# Patient Record
Sex: Female | Born: 1990 | Race: Black or African American | Hispanic: No | Marital: Single | Smoking: Never smoker
Health system: Southern US, Community
[De-identification: ages and names within clinical notes are randomized; demographics above are authoritative.]

## PROBLEM LIST (undated history)

## (undated) DIAGNOSIS — O368190 Decreased fetal movements, unspecified trimester, not applicable or unspecified: Secondary | ICD-10-CM

## (undated) DIAGNOSIS — K5900 Constipation, unspecified: Secondary | ICD-10-CM

## (undated) DIAGNOSIS — B9689 Other specified bacterial agents as the cause of diseases classified elsewhere: Secondary | ICD-10-CM

## (undated) DIAGNOSIS — N760 Acute vaginitis: Secondary | ICD-10-CM

## (undated) HISTORY — DX: Other specified bacterial agents as the cause of diseases classified elsewhere: B96.89

## (undated) HISTORY — DX: Other specified bacterial agents as the cause of diseases classified elsewhere: N76.0

## (undated) HISTORY — DX: Constipation, unspecified: K59.00

## (undated) HISTORY — PX: NO PAST SURGERIES: SHX2092

---
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
## 2013-03-27 LAB — BLOOD TYPE, (ABO+RH)
ABO,Rh: A POS
TYPE, ABO & RH, EXTERNAL: A POS

## 2013-03-27 LAB — HEMOGLOBIN: Hgb, External: 13

## 2013-03-27 LAB — ANTIBODY SCREEN: Antibody screen, External: NEGATIVE

## 2013-03-27 LAB — HEMOGLOBIN FRACTIONATION
HGB EVAL, EXTERNAL: NEGATIVE
Hemoglobin eval., External: NEGATIVE

## 2013-03-27 LAB — RPR
RPR, EXTERNAL: NEGATIVE
RPR, External: NEGATIVE

## 2013-03-27 LAB — HEPATITIS B SURFACE AG W/REFLEX: HBsAg, External: NEGATIVE

## 2013-03-27 LAB — PLATELET COUNT: Platelet cnt., External: 180

## 2013-03-27 LAB — URINALYSIS W/O MICRO

## 2013-03-27 LAB — RUBELLA AB, IGG: Rubella, External: IMMUNE

## 2013-03-27 LAB — HIV-1 AB WESTERN BLOT CONFIRM ONLY
HIV, EXTERNAL: NEGATIVE
HIV, External: NEGATIVE

## 2013-03-27 LAB — HEMATOCRIT: Hct, External: 39.3

## 2013-04-24 LAB — N GONORRHOEAE, DNA PROBE: Gonorrhea, External: NEGATIVE

## 2013-04-24 LAB — CHLAMYDIA DNA PROBE: Chlamydia, External: NEGATIVE

## 2013-04-24 LAB — PAP SMEAR

## 2013-05-08 LAB — AMB POC US OB FETAL NUCHAL TRANS, 1ST GESTATION: NT, External: NEGATIVE

## 2013-07-02 NOTE — Progress Notes (Signed)
20 weeks transfer for consult/Exam/ANA scan. Flu vaccine 05/06/13 per pt. Pt will obtain records for CF testing results.

## 2013-07-03 LAB — CBC WITH AUTOMATED DIFF
ABS. BASOPHILS: 0 10*3/uL (ref 0.0–0.2)
ABS. EOSINOPHILS: 0 10*3/uL (ref 0.0–0.4)
ABS. IMM. GRANS.: 0 10*3/uL (ref 0.0–0.1)
ABS. MONOCYTES: 0.4 10*3/uL (ref 0.1–0.9)
ABS. NEUTROPHILS: 3.9 10*3/uL (ref 1.4–7.0)
Abs Lymphocytes: 1.4 10*3/uL (ref 0.7–3.1)
BASOPHILS: 0 %
EOSINOPHILS: 0 %
HCT: 29.2 % — ABNORMAL LOW (ref 34.0–46.6)
HGB: 10.3 g/dL — ABNORMAL LOW (ref 11.1–15.9)
IMMATURE GRANULOCYTES: 0 %
Lymphocytes: 24 %
MCH: 31.3 pg (ref 26.6–33.0)
MCHC: 35.3 g/dL (ref 31.5–35.7)
MCV: 89 fL (ref 79–97)
MONOCYTES: 8 %
NEUTROPHILS: 68 %
PLATELET: 165 10*3/uL (ref 150–379)
RBC: 3.29 x10E6/uL — ABNORMAL LOW (ref 3.77–5.28)
RDW: 14.4 % (ref 12.3–15.4)
WBC: 5.8 10*3/uL (ref 3.4–10.8)

## 2013-07-03 LAB — HCV AB W/RFLX TO VERIFICATION: HCV Ab: <0.1 s/co ratio (ref 0.0–0.9)

## 2013-07-03 LAB — COMMENT

## 2013-07-03 LAB — TSH RFX ON ABNORMAL TO FREE T4: TSH: 2.41 u[IU]/mL (ref 0.450–4.500)

## 2013-07-03 NOTE — Progress Notes (Signed)
flushot offered

## 2013-07-04 NOTE — Progress Notes (Signed)
Theresa Boyd

## 2013-07-17 NOTE — Telephone Encounter (Signed)
Pt c/o some low pelvic pain- at her pubic bone- for the past 30 minutes or so. Getting better now after laying down. No uti sx, no bleeding, no vaginal discharge, no fever, no contraction-like pains. Probably round ligament pain. rec rest, fluids, getting up slowly. If sx increase to go to [facility] to be checked.

## 2013-08-01 NOTE — L&D Delivery Note (Signed)
Delivery Note    Obstetrician:  Vista DeckMatthew L Mikhala Kenan, MD    Assistant: none    Pre-Delivery Diagnosis: Term pregnancy, Spontaneous labor or Single fetus    Post-Delivery Diagnosis: Living newborn infant(s), Female or named Isabelle(Bell)    Intrapartum Event: Meconium    Procedure: Spontaneous vaginal delivery    Delivery Summary    Patient: Theresa Boyd MRN: [id number]  SSN: [id number]    Date of Birth: 12-Mar-1991  Age: 23 y.o.  Sex: female       Information for the patient's newborn:  Marney DoctorFuller, Baby Girl [[id number]][[id number]]       Labor Events:   Preterm Labor: No   Rupture Date: 11/19/2013   Rupture Time: 11:40 AM   Rupture Type AROM   Amniotic Fluid Volume: Moderate      Amniotic Fluid Description: Meconium   Induction: None       Augmentation: Oxytocin;AROM   Labor Events:       Cervical Ripening:     None     Delivery Events:  Episiotomy: None   Laceration(s): Right periurethral     Repaired: None    Number of Repair Packets:     Suture Type and Size:       Estimated Blood Loss (ml): 250ml       Delivery Date: 11/19/2013    Delivery Time: 7:43 PM  Delivery Type: Spontaneous Vaginal Delivery   Sex:  Female     Gestational Age: 7280w6d  Delivery Clinician:  Vista DeckMatthew L Zackrey Dyar  Living Status: Yes  Delivery Location: L&D              APGARS  One minute Five minutes Ten minutes   Skin Color: 0   1       Heart Rate: 2   2       Reflex Irritability: 2   2       Muscle Tone: 2   2       Respiration: 2   2       Total: 8  9        Presentation: Vertex    Position: Right Occiput Anterior  Resuscitation Method:  Suctioning-bulb  Tactile Stimulation     Meconium Stained: Thin        Cord Information: 3 Vessels      Cord Events: Nuchal  Other (Comment)  X1     Cord Blood Sent?:  Yes      Blood Gases Sent?:  Yes    Placenta:  Date: 11/19/2013   Time: 7:46 PM  Removal: Expressed    Appearance: Normal  Intact       Newborn Measurements:  Birth Weight: 7 lb 14.1 oz (3.575 kg)    Birth Length: 52 cm    Head Circumference: 35.5 cm    Chest  Circumference: 33.5 cm    Abdominal Girth:      Other Providers:   Bruce DonathMatthew L Kionna Brier  Jessica C Bradyn Soward  Judy Clour  Dola ArgyleJames R Simril  Mark A Klapperich  Jinny BlossomKaren B Covington  Toa AltaHelen M Braysen Cloward  Jeanice LimLacinda G Snow  Buelah ManisHannah E Phillips Obstetrician  Primary Nurse  Charge Nurse  Anesthesiologist  CRNA  Nurse Practitioner  Respiratory Therapist  Tech  Student Nurse                 Group B Strep:   Lab Results   Component Value Date/Time    GrBS negative 10/17/2013     Information for the patient's  newborn:  Marney DoctorFuller, Baby Girl [[id number]][[id number]]     No results found for this basename: PCTABR, PCTDIG, BILI     No results found for this basename: aph, apco2, apo2, ahco3, abec, abdc, o2st, site, rscom               Cord Blood Results:   Information for the patient's newborn:  Marney DoctorFuller, Baby Girl [[id number]][[id number]]     No results found for this basename: PCTABR, PCTDIG, BILI     Prenatal Labs:     Lab Results   Component Value Date/Time    ABO/Rh(D) A POSITIVE 11/19/2013  9:00 AM    HBsAg Negative 03/27/2013    HIV Negative 03/27/2013    Rubella Immune 03/27/2013    RPR Negative 03/27/2013    Gonorrhea Negative 04/24/2013    Chlamydia Negative 04/24/2013    GrBS negative 10/17/2013        Attending Attestation: I was present and scrubbed for the entire procedure    Signed By:  Vista DeckMatthew L Zylah Elsbernd, MD     November 19, 2013

## 2013-08-06 NOTE — Progress Notes (Signed)
Glu next

## 2013-08-10 ENCOUNTER — Inpatient Hospital Stay: Payer: Self-pay

## 2013-08-10 NOTE — Progress Notes (Signed)
Pt presented to L&D complaining of decreased fetal movement.  Pt states she did not feel any movement yesterday at lunch and drank juice and laid on her left side.  Pt states the baby started moving.  This morning she again did not feel movement and did the same thing with no results.  EFM on and assessment started.

## 2013-08-10 NOTE — Progress Notes (Signed)
Dr Maudie MercuryMazzoli notified of pt status.  Decreased fetal movement.  FHR 145 appropriate for gest age.  No uterine contractions.  Pt denies pain or leaking fluid.  Orders to discharge pt with kick count instructions.

## 2013-08-10 NOTE — Progress Notes (Signed)
Discharge instructions reviewed.  Pt verbalizes understanding.  Pt discharged home with family at side.

## 2013-09-19 NOTE — Progress Notes (Signed)
tdap

## 2013-09-20 LAB — CBC WITH AUTOMATED DIFF
ABS. BASOPHILS: 0 10*3/uL (ref 0.0–0.2)
ABS. EOSINOPHILS: 0 10*3/uL (ref 0.0–0.4)
ABS. IMM. GRANS.: 0 10*3/uL (ref 0.0–0.1)
ABS. MONOCYTES: 0.6 10*3/uL (ref 0.1–0.9)
ABS. NEUTROPHILS: 4.1 10*3/uL (ref 1.4–7.0)
Abs Lymphocytes: 2 10*3/uL (ref 0.7–3.1)
BASOPHILS: 0 %
EOSINOPHILS: 1 %
HCT: 31.2 % — ABNORMAL LOW (ref 34.0–46.6)
HGB: 11 g/dL — ABNORMAL LOW (ref 11.1–15.9)
IMMATURE GRANULOCYTES: 0 %
Lymphocytes: 30 %
MCH: 31.9 pg (ref 26.6–33.0)
MCHC: 35.3 g/dL (ref 31.5–35.7)
MCV: 90 fL (ref 79–97)
MONOCYTES: 8 %
NEUTROPHILS: 61 %
PLATELET: 148 10*3/uL — ABNORMAL LOW (ref 150–379)
RBC: 3.45 x10E6/uL — ABNORMAL LOW (ref 3.77–5.28)
RDW: 13.3 % (ref 12.3–15.4)
WBC: 6.8 10*3/uL (ref 3.4–10.8)

## 2013-09-20 LAB — GEST. DIABETES 1-HR SCREEN: Gestational Diabetes Screen: 81 mg/dL (ref 65–139)

## 2013-09-29 ENCOUNTER — Inpatient Hospital Stay

## 2013-09-29 LAB — CBC W/O DIFF
HCT: 32.2 % — ABNORMAL LOW (ref 35.8–46.3)
HGB: 10.9 g/dL — ABNORMAL LOW (ref 11.7–15.4)
MCH: 30.9 PG (ref 26.1–32.9)
MCHC: 33.9 g/dL (ref 31.4–35.0)
MCV: 91.2 FL (ref 79.6–97.8)
MPV: 12 FL (ref 10.8–14.1)
PLATELET: 127 10*3/uL — ABNORMAL LOW (ref 150–450)
RBC: 3.53 M/uL — ABNORMAL LOW (ref 4.05–5.25)
RDW: 12.8 % (ref 11.9–14.6)
WBC: 6.3 10*3/uL (ref 4.3–11.1)
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
## 2013-09-29 NOTE — Progress Notes (Signed)
Report received from Jose PersiaMelanie Clement, Charity fundraiserN. Care assumed. Patient to be discharged home at 1930, after 4 hours of monitoring.

## 2013-09-29 NOTE — Progress Notes (Signed)
Report to Kate SableJessica Donahue rn care of pt relinquished.

## 2013-09-29 NOTE — Progress Notes (Signed)
Pt to room 439 for triage with chief complaint of decreased fetal movement.. Assessment begins, EFM and Toco applied and tracing well.
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
## 2013-09-29 NOTE — Progress Notes (Signed)
Patient discharged home with husband.

## 2013-09-29 NOTE — Progress Notes (Signed)
Discharge instructions reviewed with the patient and her husband. Copy signed and placed on chart, copy given to patient.

## 2013-10-01 LAB — FETAL HEMOGLOBIN SCREEN

## 2013-10-03 NOTE — Progress Notes (Signed)
Classes rec

## 2013-10-07 NOTE — Telephone Encounter (Signed)
Pt thinks she lost her mucous plug. No contractions, no water leaking. Has 3/19 ROB appt w/ [facility]. To call or go to the hospital if she has strong contractions every 5 minutes or if SROM.

## 2013-10-10 NOTE — Telephone Encounter (Signed)
Pt c/o sore throat, headache, chest congestion. Discussed otc medications she can take including Tylenol, Mucinex, and Chloraseptic spray. If sx continue or increase to see her PCP

## 2013-10-17 LAB — GYN RAPID GP B STREP: GrBStrep, External: NEGATIVE

## 2013-10-17 NOTE — Progress Notes (Signed)
gbs and cbc today

## 2013-10-18 LAB — CBC WITH AUTOMATED DIFF
ABS. BASOPHILS: 0 10*3/uL (ref 0.0–0.2)
ABS. EOSINOPHILS: 0 10*3/uL (ref 0.0–0.4)
ABS. IMM. GRANS.: 0 10*3/uL (ref 0.0–0.1)
ABS. MONOCYTES: 0.6 10*3/uL (ref 0.1–0.9)
ABS. NEUTROPHILS: 4.1 10*3/uL (ref 1.4–7.0)
Abs Lymphocytes: 1.6 10*3/uL (ref 0.7–3.1)
BASOPHILS: 0 %
EOSINOPHILS: 0 %
HCT: 32.1 % — ABNORMAL LOW (ref 34.0–46.6)
HGB: 11.1 g/dL (ref 11.1–15.9)
IMMATURE GRANULOCYTES: 0 %
Lymphocytes: 26 %
MCH: 31 pg (ref 26.6–33.0)
MCHC: 34.6 g/dL (ref 31.5–35.7)
MCV: 90 fL (ref 79–97)
MONOCYTES: 9 %
NEUTROPHILS: 65 %
PLATELET: 128 10*3/uL — ABNORMAL LOW (ref 150–379)
RBC: 3.58 x10E6/uL — ABNORMAL LOW (ref 3.77–5.28)
RDW: 13.3 % (ref 12.3–15.4)
WBC: 6.4 10*3/uL (ref 3.4–10.8)

## 2013-10-19 LAB — STREP GP B NAA+RFLX: Grp B Strep Suscept.: NEGATIVE

## 2013-10-24 NOTE — Progress Notes (Signed)
Classes/ some nausea no other c/o

## 2013-10-30 NOTE — Progress Notes (Signed)
No travel

## 2013-11-05 NOTE — Progress Notes (Signed)
No travel

## 2013-11-07 NOTE — Telephone Encounter (Signed)
OB pt c/o very itchy rash on her feet, hands, & belly.  PUPPS?Marland Kitchen. Will use Benadryl, HC cream, Aveeno bath. Will see [facility] in the am. (pt also has low platelet count)

## 2013-11-08 NOTE — Progress Notes (Signed)
The patient is a 23 y.o. female who is seen for contact derm denies f/c food changes etc.  Onset was several days ago. Symptoms have been unchanged since. Associated symptoms include:  rash.    HISTORY:      Patient's last menstrual period was 02/06/2013.  Sexual History:    Contraception:    Current Outpatient Prescriptions on File Prior to Visit   Medication Sig Dispense Refill   ??? prenatal no.39-iron-FA #6-dha 30 mg iron-1 mg -300 mg cmpk Take  by mouth.       ??? FERROUS SULFATE, DRIED (IRON, DRIED, PO) Take 50 mg by mouth.       ??? cholecalciferol, vitamin D3, (VITAMIN D3) 2,000 unit tab Take  by mouth.         No current facility-administered medications on file prior to visit.       ROS:  Feeling well. No dyspnea or chest pain on exertion.  No abdominal pain, change in bowel habits, black or bloody stools.  No urinary tract symptoms. GYN ROS: .    PHYSICAL EXAM:  Blood pressure 110/70, weight 181 lb (82.101 kg), last menstrual period 02/06/2013.    The patient appears well, alert, oriented x 3, in no distress.  Lungs are clear. Heart RRR, no murmurs. Abdomen soft without tenderness, guarding, mass or organomegaly.  Pelvic: .    ASSESSMENT:  No diagnosis found.    PLAN:  Diagnosis explained in detail, including differential.  No orders of the defined types were placed in this encounter.       Contact derm dx all support measures

## 2013-11-12 NOTE — Progress Notes (Signed)
Pt requests MRSA check               nasal swab done              pt w/o any cutaneous lesions

## 2013-11-12 NOTE — Addendum Note (Signed)
Addended by: Forrestine HimKURTA, Orbie Grupe A on: 11/12/2013 11:57 AM     Modules accepted: Orders

## 2013-11-12 NOTE — Progress Notes (Signed)
Labor dx

## 2013-11-15 LAB — MRSA SCREENING CULTURE: MRSA Screening Culture: NEGATIVE

## 2013-11-19 ENCOUNTER — Inpatient Hospital Stay
Admit: 2013-11-19 | Discharge: 2013-11-21 | Disposition: A | Discharge status: Home / Self Care | Source: Ambulatory Visit | Attending: Obstetrics & Gynecology | Admitting: Obstetrics & Gynecology

## 2013-11-19 DIAGNOSIS — O480 Post-term pregnancy: Secondary | ICD-10-CM

## 2013-11-19 LAB — RT--CORD BLOOD GAS
BASE EXCESS,CBA: 0.7 mmol/L (ref 0.0–3.0)
HCO3,CORD BLD ARTERIAL: 29 mmol/L — ABNORMAL HIGH (ref 22–26)
PCO2,CORD BLD ARTERIAL: 64 mmHg — ABNORMAL HIGH (ref 33–49)
PH,CORD BLD ARTERIAL: 7.278 (ref 7.21–7.31)
PO2,CORD BLD ARTERIAL: <36 mmHg — ABNORMAL HIGH (ref 9–19)

## 2013-11-19 LAB — CBC W/O DIFF
HCT: 35.1 % — ABNORMAL LOW (ref 35.8–46.3)
HGB: 11.9 g/dL (ref 11.7–15.4)
MCH: 31.4 PG (ref 26.1–32.9)
MCHC: 33.9 g/dL (ref 31.4–35.0)
MCV: 92.6 FL (ref 79.6–97.8)
MPV: 12.7 FL (ref 10.8–14.1)
PLATELET: 128 10*3/uL — ABNORMAL LOW (ref 150–450)
RBC: 3.79 M/uL — ABNORMAL LOW (ref 4.05–5.25)
RDW: 13.6 % (ref 11.9–14.6)
WBC: 7.9 10*3/uL (ref 4.3–11.1)

## 2013-11-19 LAB — MRSA SCREEN - PCR (NASAL)

## 2013-11-19 LAB — TYPE & SCREEN
ABO/Rh(D): A POS
Antibody screen: NEGATIVE

## 2013-11-19 LAB — TYPE AND SCREEN
ABO/Rh: A POS
Antibody Screen: NEGATIVE

## 2013-11-19 MED ORDER — LACTATED RINGERS BOLUS IV
INTRAVENOUS | Status: AC | PRN
Start: 2013-11-19 — End: 2013-11-19
  Administered 2013-11-19: 13:00:00 via INTRAVENOUS

## 2013-11-19 MED ORDER — LACTATED RINGERS BOLUS IV
Freq: Once | INTRAVENOUS | Status: AC
Start: 2013-11-19 — End: 2013-11-19
  Administered 2013-11-19 (×2): via INTRAVENOUS

## 2013-11-19 MED ORDER — SODIUM CHLORIDE 0.9 % IJ SYRG
INTRAMUSCULAR | Status: DC | PRN
Start: 2013-11-19 — End: 2013-11-21

## 2013-11-19 MED ORDER — SODIUM CHLORIDE 0.9 % IJ SYRG
Freq: Three times a day (TID) | INTRAMUSCULAR | Status: DC
Start: 2013-11-19 — End: 2013-11-21

## 2013-11-19 MED ORDER — SODIUM CHLORIDE 0.9 % IJ SYRG
Freq: Three times a day (TID) | INTRAMUSCULAR | Status: DC
Start: 2013-11-19 — End: 2013-11-19

## 2013-11-19 MED ORDER — OXYTOCIN 30 UNIT IN 500 ML INFUSION
30 unit/500 mL | INTRAVENOUS | Status: AC
Start: 2013-11-19 — End: 2013-11-19
  Administered 2013-11-19: 16:00:00 via INTRAVENOUS

## 2013-11-19 MED ORDER — SODIUM CHLORIDE 0.9 % IJ SYRG
INTRAMUSCULAR | Status: DC | PRN
Start: 2013-11-19 — End: 2013-11-19

## 2013-11-19 MED ORDER — DEXTROSE 5%-LACTATED RINGERS IV
INTRAVENOUS | Status: DC
Start: 2013-11-19 — End: 2013-11-21
  Administered 2013-11-19 (×2): via INTRAVENOUS

## 2013-11-19 MED ADMIN — mineral oil 120 mL: TOPICAL | @ 22:00:00 | NDC 99991078804

## 2013-11-19 MED ADMIN — ropivacaine (NAROPIN) injection: EPIDURAL | @ 18:00:00 | NDC 70069006225

## 2013-11-19 MED ADMIN — ropivacaine (NAROPIN) injection: EPIDURAL | @ 15:00:00 | NDC 63323028528

## 2013-11-19 MED ADMIN — ropivacaine (NAROPIN) injection: EPIDURAL | @ 15:00:00 | NDC 70069006225

## 2013-11-19 MED ADMIN — lidocaine (XYLOCAINE) 10 mg/mL (1 %) injection 0.1 mL: INTRADERMAL | NDC 00409427601

## 2013-11-19 MED FILL — MINERAL OIL ORAL: ORAL | Qty: 120

## 2013-11-19 MED FILL — OXYTOCIN 30 UNIT IN 500 ML INFUSION: 30 unit/500 mL | INTRAVENOUS | Qty: 500

## 2013-11-19 MED FILL — LIDOCAINE HCL 1 % (10 MG/ML) IJ SOLN: 10 mg/mL (1 %) | INTRAMUSCULAR | Qty: 20

## 2013-11-19 NOTE — Anesthesia Procedure Notes (Signed)
Epidural Block    Start time: 11/19/2013 10:34 AM  End time: 11/19/2013 10:46 AM  Reason for block: labor epidural  Staffing  Anesthesiologist: Jacquelynn CreeSIMRIL, Carollyn Etcheverry R  Performed by: anesthesiologist   Prep  Risks and benefits discussed with the patient and plans are to proceed   Site marked, Timeout performed, 10:34  Patient was placed in seated position  The back was prepped at the lumbar region  Prep Solution(s): chlorhexidine  Epidural  Epidural Location: L3-4 (3 cc 1% lidocaine at insertion site)  Needle: 17G Tuohy  Attempts: needle passed 1 time(s) with loss of resistance using saline  Catheter: 19 G epidural catheter placed/secured  Catheter at skin depth: approximately 12cm  Catheter into epidural space: approximately 5cm  No blood with aspiration, no cerebrospinal fluid with aspiration, no paresthesia and negative aspiration test  Test dose: lidocaine 1.5% w/ epi, negative  Assessment  Catheter was secured to back with tegaderm and tape  Insertion was uncomplicated and patient tolerated without any apparent complications

## 2013-11-19 NOTE — Progress Notes (Signed)
Orders received to admit for labor.

## 2013-11-19 NOTE — Progress Notes (Addendum)
1034- epidural timeout

## 2013-11-19 NOTE — Progress Notes (Signed)
Pushing well.

## 2013-11-19 NOTE — Progress Notes (Signed)
Pt to triage with complaints of contractions. SVE 4/90/-1

## 2013-11-19 NOTE — Progress Notes (Signed)
sve 6cms/ 50/-2. Pt placed on the peanut. Resting comfortably on her right side. fhr reactive. Family at bs.

## 2013-11-19 NOTE — Progress Notes (Signed)
1031- MDA at bs

## 2013-11-19 NOTE — Progress Notes (Signed)
In and out cath for 400 mls / sve: 8-9 / c//0. Pt repositioned to the left side and penaut utilized. Pitocin remains off.

## 2013-11-19 NOTE — Progress Notes (Signed)
Motrin 400 mg and percocet 5 mg x 2 given po for complaints of cramping.

## 2013-11-19 NOTE — Progress Notes (Signed)
SBAR report [facility] from [facility],RN.  Care of pt assumed  Pt pushing

## 2013-11-19 NOTE — Progress Notes (Signed)
1620- dr Katrinka Blazingsmith at bs. Sve: ant lip/ pt repositioned to the left side on the peanut.   1625- pt off peanut, back to the right side. Sitting straight up in the bed.

## 2013-11-19 NOTE — Progress Notes (Signed)
Dr Leonie DouglasSmith,surgical tech, charge RN, and nursery called for delivery

## 2013-11-19 NOTE — Progress Notes (Signed)
Dr Katrinka BlazingSmith sent a text to call L&D.

## 2013-11-19 NOTE — Progress Notes (Signed)
Report to Carley Hammedjessica smith, rn. Pt continues to push well

## 2013-11-19 NOTE — Progress Notes (Signed)
Admission assessment completed per protocol, plan of care discussed with patient. Patient denies any complaints. Instructed to call for any needs or questions. Voices understanding.

## 2013-11-19 NOTE — Progress Notes (Signed)
In and out cath for 500 mls/ clear urine. Pt repositioned to the left side/ peanut utilized on this side. Sve: 6-7/75/0. Family at bs. Pt requesting her epidural pump to be turned down.

## 2013-11-19 NOTE — Progress Notes (Signed)
Dr Katrinka BlazingSmith at bs for delivery  Pt to lithotomy and prepped for delivery

## 2013-11-19 NOTE — Progress Notes (Signed)
Resting quietly

## 2013-11-19 NOTE — Progress Notes (Signed)
Back to bed from walking. EFM reapplied. fhr 120's

## 2013-11-19 NOTE — Progress Notes (Signed)
Consents witnessed, iv started, labs drawn.

## 2013-11-19 NOTE — Progress Notes (Signed)
Pt would like her epidural when her labs are resulted. Pt would like to walk until then. Ok to walk by dr Katrinka Blazingsmith.

## 2013-11-19 NOTE — Progress Notes (Addendum)
Pericare performed.  OB pads, gown and ice pack changed.  Assisted to w/c for transfer to room 456

## 2013-11-19 NOTE — Progress Notes (Signed)
Pt pushing. C/c/0. Comfortable with epidural.

## 2013-11-19 NOTE — Progress Notes (Signed)
Pt called out feeling pressure. Sve. No change. Pitocin off, lr bolus started, o2 via non-rebreather face mask. Repositioned to the right side. Anesthesia to turn her epidural now.

## 2013-11-19 NOTE — H&P (Signed)
History & Physical    Name: Blanchard Kelchlexandria Rings MRN: [id number]  SSN: [id number]    Date of Birth: 1991-05-05  Age: 23 y.o.  Sex: female        Subjective:     Estimated Date of Delivery: 11/13/13  OB History   Gravida Para Term Preterm AB SAB TAB Ectopic Multiple Living   1               # Outcome Date GA Lbr Len/2nd Weight Sex Delivery Anes PTL Lv   1 Current                   Ms. Toni ArthursFuller is admitted with pregnancy at 9027w6d for active labor. Prenatal course was normal. Please see prenatal records for details.    Past Medical History   Diagnosis Date   ??? Anemia      Past Surgical History   Procedure Laterality Date   ??? Hx wisdom teeth extraction       Social History     Occupational History   ??? Not on file.     Social History Main Topics   ??? Smoking status: Never Smoker    ??? Smokeless tobacco: Never Used   ??? Alcohol Use: No   ??? Drug Use: No   ??? Sexual Activity:     Partners: Male     Pharmacist, hospitalBirth Control/ Protection: None     Family History   Problem Relation Age of Onset   ??? Lupus Mother    ??? Diabetes Paternal Grandmother        Allergies   Allergen Reactions   ??? Pcn [Penicillins] Swelling     Prior to Admission medications    Medication Sig Start Date End Date Taking? Authorizing Provider   prenatal no.39-iron-FA #6-dha 30 mg iron-1 mg -300 mg cmpk Take  by mouth.   Yes Historical Provider   FERROUS SULFATE, DRIED (IRON, DRIED, PO) Take 50 mg by mouth.   Yes Historical Provider   cholecalciferol, vitamin D3, (VITAMIN D3) 2,000 unit tab Take  by mouth.   Yes Historical Provider        Review of Systems: A comprehensive review of systems was negative except for that written in the HPI.    Objective:     Vitals:  Filed Vitals:    11/19/13 0803 11/19/13 0804   BP:  108/65   Pulse:  70   Temp:  97.4 ??F (36.3 ??C)   Resp:  18   Height: 5\' 7"  (1.702 m)    Weight: 183 lb (83.008 kg)         Physical Exam:  Patient without distress.  Heart: Regular rate and rhythm  Lung: clear to auscultation throughout lung fields, no wheezes, no  rales, no rhonchi and normal respiratory effort  Abdomen: soft, nontender  Cervical Exam: 4 cm dilated    90% effaced    -2 station    Membranes:  Intact  Fetal Heart Rate: Reactive    Prenatal Labs:   Lab Results   Component Value Date/Time    ABO,Rh A positive 03/27/2013    Rubella Immune 03/27/2013    GrBS negative 10/17/2013    HBsAg Negative 03/27/2013    HIV Negative 03/27/2013    RPR Negative 03/27/2013    Gonorrhea Negative 04/24/2013    Chlamydia Negative 04/24/2013         Assessment/Plan:     Plan: Admit for Reassuring fetal status, Continue plan for vaginal delivery.  Group B Strep was negative.    Signed By:  Vista DeckMatthew L Jabes Primo, MD     November 19, 2013

## 2013-11-19 NOTE — Progress Notes (Signed)
In and out cath 300 mls clear urine. Sve: "rim" / c/ 0. Pt sitting straight up in bed with legs butterflied. Comfortable. Feeling pressure.

## 2013-11-19 NOTE — Progress Notes (Signed)
Hx of mrsa exposure. One culture resulted and is neg. Will complete another swab now.

## 2013-11-19 NOTE — Progress Notes (Signed)
SBAR IN Report: Mother    Verbal report received from Jessica Smith, RN (full name & credentials) on this patient, who is now being transferred from L&D (unit) for routine progression of care.  The patient is not wearing a green "Anesthesia-Duramorph" band.    Report consisted of patient's Situation, Background, Assessment and Recommendations (SBAR).       Newborn ID bands were compared with the identification form, and verified with the patient and transferring nurse.    Information from the SBAR, Kardex and Intake/Output and the [facility] Report was reviewed with the transferring nurse; opportunity for questions and clarification provided.

## 2013-11-19 NOTE — Progress Notes (Signed)
Placenta.  Pitocin infusing at 250 ml/hr

## 2013-11-19 NOTE — Progress Notes (Signed)
1037-epdiural in place  1038- test dose

## 2013-11-19 NOTE — Progress Notes (Signed)
Dr Katrinka Blazingsmith updated on pt status and sve results. Orders for pitocin augmentation. Pt resting on the peanut.

## 2013-11-19 NOTE — Anesthesia Post-Procedure Evaluation (Signed)
Post-Anesthesia Evaluation and Assessment    Patient: Theresa Boyd MRN: [id number]  SSN: [id number]    Date of Birth: 1991/03/24  Age: 23 y.o.  Sex: female       Cardiovascular Function/Vital Signs  Visit Vitals   Item Reading   ??? BP 103/55   ??? Pulse 94   ??? Temp 36.6 ??C (97.9 ??F)   ??? Resp 18   ??? Ht 5\' 7"  (1.702 m)   ??? Wt 83.008 kg (183 lb)   ??? BMI 28.66 kg/m2   ??? Breastfeeding Yes       Patient is status post epidural anesthesia for * No procedures listed *.    Nausea/Vomiting: None    Postoperative hydration reviewed and adequate.    Pain:  Pain Scale 1: Numeric (0 - 10) (11/19/13 2100)  Pain Intensity 1: 0 (11/19/13 2100)   Managed    Neurological Status:   Neuro (WDL): Within Defined Limits (11/19/13 2100)   At baseline    Mental Status and Level of Consciousness: Alert and oriented     Pulmonary Status:   O2 Device: Room air (11/19/13 2115)   Adequate oxygenation and airway patent    Complications related to anesthesia: None    Post-anesthesia assessment completed. No concerns. Pt very pleased    Signed By: Leonette MonarchJames R Rylan Bernard, MD     November 19, 2013

## 2013-11-19 NOTE — Progress Notes (Signed)
Dr Katrinka BlazingSmith at bs.  Observing pushing

## 2013-11-19 NOTE — Progress Notes (Signed)
Del vaginally.  Viable baby girl.  Infant brought to warmer for initial assessment per NNP due to meconium stained fluid noted during labor.

## 2013-11-19 NOTE — Anesthesia Procedure Notes (Signed)
Epidural Block    Start time: 11/19/2013 10:34 AM  End time: 11/19/2013 10:46 AM  Reason for block: labor epidural  Staffing  Anesthesiologist: Idali Lafever R  Performed by: anesthesiologist   Prep  Risks and benefits discussed with the patient and plans are to proceed   Site marked, Timeout performed, 10:34  Patient was placed in seated position  The back was prepped at the lumbar region  Prep Solution(s): chlorhexidine  Epidural  Epidural Location: L3-4 (3 cc 1% lidocaine at insertion site)  Needle: 17G Tuohy  Attempts: needle passed 1 time(s) with loss of resistance using saline  Catheter: 19 G epidural catheter placed/secured  Catheter at skin depth: approximately 12cm  Catheter into epidural space: approximately 5cm  No blood with aspiration, no cerebrospinal fluid with aspiration, no paresthesia and negative aspiration test  Test dose: lidocaine 1.5% w/ epi, negative  Assessment  Catheter was secured to back with tegaderm and tape  Insertion was uncomplicated and patient tolerated without any apparent complications

## 2013-11-19 NOTE — Anesthesia Pre-Procedure Evaluation (Addendum)
Anesthetic History   No history of anesthetic complications           Review of Systems / Medical History  Patient summary reviewed and pertinent labs reviewed    Pulmonary  Within defined limits               Neuro/Psych   Within defined limits           Cardiovascular  Within defined limits              Exercise tolerance: >4 METS  Comments: Denies CP, SOB or changes in functional status   GI/Hepatic/Renal     GERD: well controlled             Endo/Other             Other Findings   Comments: Anemia         Physical Exam    Airway  Mallampati: II  TM Distance: 4 - 6 cm  Neck ROM: normal range of motion   Mouth opening: Normal     Cardiovascular    Rhythm: regular  Rate: normal         Dental  No notable dental hx       Pulmonary  Breath sounds clear to auscultation               Abdominal  GI exam deferred       Other Findings            Anesthetic Plan    ASA: 2  Anesthesia type: epidural          Induction: Intravenous  Anesthetic plan and risks discussed with: Patient and Family

## 2013-11-19 NOTE — Progress Notes (Signed)
SBAR OUT Report: Mother    Verbal report given to C.Waldrop,RN on this patient, who is now being transferred to [facility] for routine progression of care.   The patient is not wearing a green "Anesthesia-Duramorph" band.    Report consisted of patient's Situation, Background, Assessment and Recommendations (SBAR).       Newborn ID bands were compared with the identification form, and verified with the patient and receiving nurse.    Information from the SBAR, Intake/Output, MAR and Recent Results and the [facility] Report was reviewed with the receiving nurse; opportunity for questions and clarification provided.

## 2013-11-19 NOTE — Progress Notes (Signed)
Dr Katrinka Blazingsmith at [facility]. [facility]. Arom. Meconium. Per dr Katrinka Blazingsmith "ant lip". Recheck in 30 minutes.

## 2013-11-19 NOTE — Progress Notes (Signed)
Recovery started.  Epidural infusing stopped  Infant skin to skin.  Denies any pain at this time

## 2013-11-19 NOTE — Progress Notes (Signed)
Assisted up to bathroom, voided. Peri care done. Pad and chux changed. Declines ice pack at this time.

## 2013-11-20 MED ADMIN — ibuprofen (MOTRIN) tablet 400 mg: ORAL | @ 02:00:00 | NDC 62584074601

## 2013-11-20 MED ADMIN — oxyCODONE-acetaminophen (PERCOCET) 5-325 mg per tablet 2 Tab: ORAL | @ 16:00:00 | NDC 00406051223

## 2013-11-20 MED ADMIN — ibuprofen (MOTRIN) tablet 400 mg: ORAL | @ 07:00:00 | NDC 68084065811

## 2013-11-20 MED ADMIN — docusate sodium (COLACE) capsule 100 mg: ORAL | @ 13:00:00 | NDC 62584068311

## 2013-11-20 MED ADMIN — ibuprofen (MOTRIN) tablet 400 mg: ORAL | @ 20:00:00 | NDC 68084065811

## 2013-11-20 MED ADMIN — docusate sodium (COLACE) capsule 100 mg: ORAL | @ 20:00:00 | NDC 62584068311

## 2013-11-20 MED ADMIN — ibuprofen (MOTRIN) tablet 400 mg: ORAL | @ 17:00:00 | NDC 68084065811

## 2013-11-20 MED ADMIN — oxyCODONE-acetaminophen (PERCOCET) 5-325 mg per tablet 2 Tab: ORAL | @ 13:00:00 | NDC 00406051223

## 2013-11-20 MED ADMIN — oxyCODONE-acetaminophen (PERCOCET) 5-325 mg per tablet 2 Tab: ORAL | @ 02:00:00 | NDC 00406051223

## 2013-11-20 MED ADMIN — oxyCODONE-acetaminophen (PERCOCET) 5-325 mg per tablet 2 Tab: ORAL | @ 07:00:00 | NDC 00406051223

## 2013-11-20 MED ADMIN — witch hazel-glycerin (TUCKS) 12.5-50 % pads 1 Each: @ 13:00:00 | NDC 50289325001

## 2013-11-20 MED ADMIN — oxyCODONE-acetaminophen (PERCOCET) 5-325 mg per tablet 2 Tab: ORAL | @ 20:00:00 | NDC 00406051223

## 2013-11-20 MED ADMIN — ibuprofen (MOTRIN) tablet 400 mg: ORAL | @ 13:00:00 | NDC 68084065811

## 2013-11-20 MED FILL — OXYCODONE-ACETAMINOPHEN 5 MG-325 MG TAB: 5-325 mg | ORAL | Qty: 2

## 2013-11-20 MED FILL — NAROPIN (PF) 2 MG/ML (0.2 %) INJECTION SOLUTION: 2 mg/mL (0. %) | INTRAMUSCULAR | Qty: 81.93

## 2013-11-20 MED FILL — IBUPROFEN 400 MG TAB: 400 mg | ORAL | Qty: 1

## 2013-11-20 MED FILL — DOCUSATE SODIUM 100 MG CAP: 100 mg | ORAL | Qty: 1

## 2013-11-20 MED FILL — A.E.R. WITCH HAZEL 12.5 %-50 % TOPICAL PADS: CUTANEOUS | Qty: 40

## 2013-11-20 NOTE — Progress Notes (Signed)
Report received from Kerri Southerlin RN, care assumed.

## 2013-11-20 NOTE — Lactation Note (Signed)
In to see mom and infant for first time. Infant asleep in visitor's arms. Mom stated that infant is latching and nursing well. Stated that infant has actually been cluster feeding some. Reviewed expectations of first 24 hours as well as second night of life. Answered questions regarding pumping and storage of milk as well as introduction of bottle. Lactation consultant will follow up in am.

## 2013-11-20 NOTE — Lactation Note (Signed)
First visit with breastfeeding mom.  Discussed feeding baby on cue.  Opening mouth, turning head from side to side, bringing hands to mouth and sucking movements are all early hunger cues.  Crying is a late hunger cue.  Do lots of skin to skin to help recognize early feeding cues.  If baby does not nurse, continue skin to skin and offer again later.  On average newborns eat at least 8 or more times per day without restriction. Cluster feeding is normal.  Reviewed positioning techniques and signs of a good latch.  Discussed holding baby so that ear, shoulder and hip are in a straight line.  Baby is held close and nose lines up with mom's nipple.  Discussed supporting baby's neck and mom's breast.  When baby opens wide bring baby quickly onto the breast.  Try for an assymetrical latch with nipple pointed towards the roof of baby's mouth.  Mouth should be wide and lips flanged around the breast.  Encouraged to nurse on the first breast until baby finishes that side.  If baby comes off the breast quickly, you can re-offer that same side to ensure good stimulation before moving baby to next side.  Offer the second breast, baby can take the second breast as long as desired.  It is ok if they do not take the second side when offered.  Listen and look for swallows.  Once milk is in over the next few days, swallowing will become more frequent and obvious.  If baby pausing on the breast longer than 10 seconds, remind baby to nurse.  Attempt to burp between breasts and after feeding.  Rotate which breast the baby starts on.  Discussed expected output based on age.  Discussed feed on demand.  If necessary rouse for feedings at least until above birth weight.  Reviewed Breastfeeding Packet and encouraged mom to write down feedings and output at least until first Ped visit.  In accordance with the 2012 AAP Policy Statement on Breastfeeding, Mothers of healthy term breastfed infants should be delay pacifier use until breastfeeding  is well-established, usually about 3 to 4 wk after birth.  Pacifiers are not available on the Mother Infant Unit.  Artificial nipples should also be avoided until breastfeeding is well established.

## 2013-11-20 NOTE — Progress Notes (Signed)
The patient was satisfied with her labor epidural and denies any complications.  Her lower extremities have returned to baseline neurologically and the insertion site is unremarkable.

## 2013-11-20 NOTE — Progress Notes (Signed)
Post-Partum Day Number 1 Progress Note    Patient doing well post-partum without significant complaint.  Voiding withour difficulty, normal lochia.    Vitals:  Patient Vitals for the past 8 hrs:   BP Temp Pulse Resp   11/20/13 0700 99/57 mmHg 98 ??F (36.7 ??C) 70 18     Temp (24hrs), Avg:98.2 ??F (36.8 ??C), Min:97.8 ??F (36.6 ??C), Max:98.5 ??F (36.9 ??C)      Vital signs stable, afebrile.    Exam:  Patient without distress.               Abdomen soft, fundus firm at level of umbilicus, nontender               Perineum with normal lochia noted.               Lower extremities are negative for swelling, cords or tenderness.    Lab/Data Review:  All lab results for the last 24 hours reviewed.    Assessment and Plan:  Patient appears to be having uncomplicated post-partum course.  Continue routine perineal care and maternal education.  Plan discharge tomorrow if no problems occur.

## 2013-11-20 NOTE — Progress Notes (Signed)
Motrin 400 mg and percocet 5 mg x 2 given po for complaints of cramping.

## 2013-11-20 NOTE — Progress Notes (Signed)
Report received from Cynthia Waldrop RN, care assumed.

## 2013-11-20 NOTE — Progress Notes (Signed)
Assessment complete. Pt given 400 mg Motrin and 2 Percocet tablets. RN to reassess.

## 2013-11-20 NOTE — Progress Notes (Signed)
Bedside report received from Erskine SpeedMeagan Eby, RN. Patient care assumed. Patient denies needs at present time.

## 2013-11-21 MED ORDER — OXYCODONE-ACETAMINOPHEN 5 MG-325 MG TAB
5-325 mg | ORAL_TABLET | ORAL | Status: DC | PRN
Start: 2013-11-21 — End: 2014-01-09

## 2013-11-21 MED ADMIN — oxyCODONE-acetaminophen (PERCOCET) 5-325 mg per tablet 2 Tab: ORAL | @ 13:00:00 | NDC 68084035511

## 2013-11-21 MED ADMIN — ibuprofen (MOTRIN) tablet 400 mg: ORAL | @ 02:00:00 | NDC 68084065811

## 2013-11-21 MED ADMIN — diph,Pertuss(AC),Tet Vac-PF (BOOSTRIX) suspension 0.5 mL: INTRAMUSCULAR | @ 13:00:00 | NDC 58160084201

## 2013-11-21 MED ADMIN — oxyCODONE-acetaminophen (PERCOCET) 5-325 mg per tablet 2 Tab: ORAL | @ 05:00:00 | NDC 68084035511

## 2013-11-21 MED ADMIN — ibuprofen (MOTRIN) tablet 400 mg: ORAL | @ 13:00:00 | NDC 68084065811

## 2013-11-21 MED ADMIN — witch hazel-glycerin (TUCKS) 12.5-50 % pads 1 Each: @ 13:00:00 | NDC 50289325001

## 2013-11-21 MED ADMIN — oxyCODONE-acetaminophen (PERCOCET) 5-325 mg per tablet 2 Tab: ORAL | @ 02:00:00 | NDC 00406051223

## 2013-11-21 MED ADMIN — ibuprofen (MOTRIN) tablet 400 mg: ORAL | @ 05:00:00 | NDC 68084065811

## 2013-11-21 MED FILL — OXYCODONE-ACETAMINOPHEN 5 MG-325 MG TAB: 5-325 mg | ORAL | Qty: 2

## 2013-11-21 MED FILL — A.E.R. WITCH HAZEL 12.5 %-50 % TOPICAL PADS: CUTANEOUS | Qty: 40

## 2013-11-21 MED FILL — IBUPROFEN 400 MG TAB: 400 mg | ORAL | Qty: 1

## 2013-11-21 MED FILL — BOOSTRIX TDAP 2.5 LF UNIT-8 MCG-5 LF/0.5 ML INTRAMUSCULAR SUSPENSION: INTRAMUSCULAR | Qty: 1

## 2013-11-21 NOTE — Lactation Note (Signed)
Mom and baby going home today. Infant latched to left breast in cradle hold at time of visit. Good latch noted. Mom states infant has been cluster feeding. Infant came off breast, nipple slightly compressed. Infant still showing feeding cues. Assisted with latch in football hold on right breast. Demonstrated getting infant to latch deeply with wide open mouth. Mom states she had some bleeding from left nipple last night. Small crack noted. Discussed rotating lanolin and bacitracin. Hand expression taught. Colostrum easily expressed. Discussed need for minimum of 8 feedings in 24 hours and watch output. Reviewed engorgement, jaundice, and weight expectations. Outpatient resources handout given. Encouraged mom to call with needs.

## 2013-11-21 NOTE — Discharge Summary (Signed)
+  +                              Post-Partum Day Number 2 Progress/Discharge Note    Patient doing well post-partum without significant complaint.  Voiding without difficulty, normal lochia, positive flatus.    Vitals:  Patient Vitals for the past 8 hrs:   BP Temp Pulse Resp   11/21/13 0706 107/67 mmHg 98.3 ??F (36.8 ??C) 65 18     Temp (24hrs), Avg:98.4 ??F (36.9 ??C), Min:98.3 ??F (36.8 ??C), Max:98.4 ??F (36.9 ??C)      Vital signs stable, afebrile.    Exam:  Patient without distress.               Abdomen soft, fundus firm at level of umbilicus, non tender               Perineum with normal lochia noted.               Lower extremities are negative for swelling, cords or tenderness.    Lab/Data Review:  All lab results for the last 24 hours reviewed.    Assessment and Plan:  Patient appears to be having uncomplicated post-partum course.  Continue routine perineal care and maternal education.  Plan discharge for today with follow up in our office in 6 weeks.

## 2013-11-21 NOTE — Progress Notes (Signed)
Pt discharged to home after ID bands verified and newborns code alert removed. Discharge teaching complete, pt verbalizes understanding; questions encouraged.  Mom walked to vehicle, while RN carried baby to car and placed in rear facing car seat.  Stable at discharge.

## 2013-11-21 NOTE — Lactation Note (Signed)
Mom and baby are going home today.  Continue to offer the breast without restriction.  Mom's milk should be fully in over the next few days.  Reviewed engorgement precautions.  Hand Expression has been demoed and written hand-out reviewed.  As milk comes in baby will be more alert at the breast and swallows will be more obvious.  Breasts may feel softer once baby has finished nursing.  Baby should be back to birth weight by 2 weeks of age.  And then gain on average 1 oz per day for the next 2-3 months.  Reviewed babies should be exclusively breastfeeding for the first 6 months and that breastfeeding should continue after introduction of appropriate complimentary foods after 6 months.  Initial output should be at least 1 wet and 1 bowel movement for each day old baby is.  By day 5-7 once milk is fully in baby will consistently have 6 or more soaking wet diapers and about 4 bowel movement.  Some babies have a bowel movement with every feeding and some have 1-3 large bowel movements each day.  Inadequate output may indicate inadequate feedings and should be reported to your Pediatrician.  Bowel habits may change as baby gets older.  Encouraged follow-up at Pediatrician in 1-2 days, no later than 1 week of age.  Call OP Lactation Center for any questions as needed or to set up an OP visit.  OP phone calls are returned within 24 hours.  Breastfeeding Support Group is offered here monthly.  Community Breastfeeding Resource List given.

## 2013-11-21 NOTE — Progress Notes (Signed)
Report received from Jamie Lake RN, care assumed.

## 2014-01-09 MED ORDER — METRONIDAZOLE 0.75 % VAGINAL GEL
0.75 % (37.5mg/5 gram) | Freq: Every day | VAGINAL | Status: AC
Start: 2014-01-09 — End: 2014-01-14

## 2014-01-09 NOTE — Addendum Note (Signed)
Addended by: Regenia SkeeterBURFORD, Red Mandt B on: 01/09/2014 04:33 PM     Modules accepted: Orders

## 2014-01-09 NOTE — Progress Notes (Signed)
HISTORY OF PRESENT ILLNESS  Theresa Boyd is a 23 y.o. female.  Post-Partum Care  Pertinent negatives include no chest pain, no abdominal pain, no headaches and no shortness of breath.       Review of Systems   Constitutional: Negative for fever, chills, weight loss, malaise/fatigue and diaphoresis.   HENT: Negative for congestion, ear discharge, ear pain, hearing loss, nosebleeds, sore throat and tinnitus.    Eyes: Negative for blurred vision, double vision, photophobia, pain, discharge and redness.   Respiratory: Negative for cough, hemoptysis, sputum production, shortness of breath, wheezing and stridor.    Cardiovascular: Negative for chest pain, palpitations, orthopnea, claudication, leg swelling and PND.   Gastrointestinal: Positive for constipation. Negative for heartburn, nausea, vomiting, abdominal pain, diarrhea, blood in stool and melena.        Rectal bleeding   Genitourinary: Negative for dysuria, urgency, frequency, hematuria and flank pain.   Musculoskeletal: Negative for myalgias, back pain, joint pain, falls and neck pain.   Skin: Negative for itching and rash.   Neurological: Negative for dizziness, tingling, tremors, sensory change, speech change, focal weakness, seizures, loss of consciousness, weakness and headaches.   Endo/Heme/Allergies: Negative for environmental allergies and polydipsia. Does not bruise/bleed easily.   Psychiatric/Behavioral: Negative for depression, suicidal ideas, hallucinations, memory loss and substance abuse. The patient is not nervous/anxious and does not have insomnia.      No blues/pnv decl contra pap due 12/15//affirm hemorrhoids anusol  Caro sx    Physical Exam    ASSESSMENT and PLAN  the following changes in treatment are made: see plan

## 2014-01-10 LAB — VAGINOSIS/VAGINITIS, DNA PROBE
Candida species: NEGATIVE
Gardnerella vaginalis: NEGATIVE
Trichomonas vaginalis: NEGATIVE

## 2014-07-08 ENCOUNTER — Encounter: Attending: Obstetrics & Gynecology

## 2014-09-17 ENCOUNTER — Other Ambulatory Visit: Payer: Self-pay | Admitting: Internal Medicine

## 2014-09-17 ENCOUNTER — Other Ambulatory Visit (HOSPITAL_COMMUNITY)
Admission: RE | Admit: 2014-09-17 | Discharge: 2014-09-17 | Disposition: A | Discharge status: Home / Self Care | Payer: BLUE CROSS/BLUE SHIELD | Source: Ambulatory Visit | Attending: Internal Medicine | Admitting: Internal Medicine

## 2014-09-17 DIAGNOSIS — Z01419 Encounter for gynecological examination (general) (routine) without abnormal findings: Secondary | ICD-10-CM | POA: Insufficient documentation

## 2014-09-19 LAB — CYTOLOGY - PAP

## 2014-11-04 ENCOUNTER — Emergency Department (HOSPITAL_BASED_OUTPATIENT_CLINIC_OR_DEPARTMENT_OTHER)
Admission: EM | Admit: 2014-11-04 | Discharge: 2014-11-04 | Disposition: A | Discharge status: Home / Self Care | Payer: BLUE CROSS/BLUE SHIELD | Attending: Emergency Medicine | Admitting: Emergency Medicine

## 2014-11-04 ENCOUNTER — Encounter (HOSPITAL_BASED_OUTPATIENT_CLINIC_OR_DEPARTMENT_OTHER): Payer: Self-pay

## 2014-11-04 DIAGNOSIS — R112 Nausea with vomiting, unspecified: Secondary | ICD-10-CM | POA: Diagnosis present

## 2014-11-04 DIAGNOSIS — R51 Headache: Secondary | ICD-10-CM | POA: Diagnosis not present

## 2014-11-04 DIAGNOSIS — Z880 Allergy status to penicillin: Secondary | ICD-10-CM | POA: Insufficient documentation

## 2014-11-04 DIAGNOSIS — Z3202 Encounter for pregnancy test, result negative: Secondary | ICD-10-CM | POA: Insufficient documentation

## 2014-11-04 LAB — BASIC METABOLIC PANEL
Anion gap: 13 (ref 5–15)
BUN: 16 mg/dL (ref 6–23)
CALCIUM: 9.1 mg/dL (ref 8.4–10.5)
CO2: 23 mmol/L (ref 19–32)
CREATININE: 0.86 mg/dL (ref 0.50–1.10)
Chloride: 102 mmol/L (ref 96–112)
GFR calc Af Amer: >90 mL/min (ref >90)
GFR calc non Af Amer: >90 mL/min (ref >90)
Glucose, Bld: 80 mg/dL (ref 70–99)
Potassium: 3.4 mmol/L — ABNORMAL LOW (ref 3.5–5.1)
Sodium: 138 mmol/L (ref 135–145)

## 2014-11-04 LAB — CBC WITH DIFFERENTIAL/PLATELET
BASOS ABS: 0 10*3/uL (ref 0.0–0.1)
Basophils Relative: 0 % (ref 0–1)
EOS PCT: 0 % (ref 0–5)
Eosinophils Absolute: 0 10*3/uL (ref 0.0–0.7)
HCT: 40.3 % (ref 36.0–46.0)
HEMOGLOBIN: 13.3 g/dL (ref 12.0–15.0)
LYMPHS ABS: 0.8 10*3/uL (ref 0.7–4.0)
Lymphocytes Relative: 15 % (ref 12–46)
MCH: 29.6 pg (ref 26.0–34.0)
MCHC: 33 g/dL (ref 30.0–36.0)
MCV: 89.6 fL (ref 78.0–100.0)
MONO ABS: 0.4 10*3/uL (ref 0.1–1.0)
MONOS PCT: 7 % (ref 3–12)
Neutro Abs: 4.3 10*3/uL (ref 1.7–7.7)
Neutrophils Relative %: 78 % — ABNORMAL HIGH (ref 43–77)
Platelets: 135 10*3/uL — ABNORMAL LOW (ref 150–400)
RBC: 4.5 MIL/uL (ref 3.87–5.11)
RDW: 12.7 % (ref 11.5–15.5)
WBC: 5.5 10*3/uL (ref 4.0–10.5)

## 2014-11-04 LAB — URINALYSIS, ROUTINE W REFLEX MICROSCOPIC
Glucose, UA: NEGATIVE mg/dL
Hgb urine dipstick: NEGATIVE
Ketones, ur: >80 mg/dL — AB
Leukocytes, UA: NEGATIVE
Nitrite: NEGATIVE
Protein, ur: NEGATIVE mg/dL
Specific Gravity, Urine: 1.034 — ABNORMAL HIGH (ref 1.005–1.030)
Urobilinogen, UA: 0.2 mg/dL (ref 0.0–1.0)
pH: 5.5 (ref 5.0–8.0)

## 2014-11-04 LAB — PREGNANCY, URINE: Preg Test, Ur: NEGATIVE

## 2014-11-04 MED ORDER — ONDANSETRON 8 MG PO TBDP: 8.0000 mg | ORAL_TABLET | Freq: Three times a day (TID) | ORAL | Status: DC | PRN

## 2014-11-04 MED ORDER — SODIUM CHLORIDE 0.9 % IV SOLN
1000.0000 mL | Freq: Once | INTRAVENOUS | Status: AC
  Administered 2014-11-04: 1000 mL via INTRAVENOUS

## 2014-11-04 MED ORDER — ONDANSETRON HCL 4 MG/2ML IJ SOLN
4.0000 mg | Freq: Once | INTRAMUSCULAR | Status: AC
  Administered 2014-11-04: 4 mg via INTRAVENOUS
  Filled 2014-11-04: qty 2

## 2014-11-04 MED ORDER — SODIUM CHLORIDE 0.9 % IV SOLN: 1000.0000 mL | INTRAVENOUS | Status: DC

## 2014-11-04 NOTE — ED Provider Notes (Signed)
CSN: [id number]     Arrival date & time 11/04/14  1120 History   First MD Initiated Contact with Patient 11/04/14 1142     Chief Complaint  Patient presents with  . Emesis    Patient is a 24 y.o. female presenting with vomiting. The history is provided by the patient.  Emesis Severity:  Moderate Duration:  1 day Timing:  Constant Number of daily episodes:  10 Quality:  Stomach contents Chronicity:  New Relieved by:  Nothing Associated symptoms: no abdominal pain, no cough, no diarrhea and no fever   Risk factors: sick contacts   Risk factors: no suspect food intake and no travel to endemic areas    patient states her symptoms started last evening. Other family members have been ill as well. Said some abdominal cramping but no significant abdominal pain. She also has a bit of a headache but no neck pain or stiffness.  the headache started after she started to feel dehydrated.  History reviewed. No pertinent past medical history. History reviewed. No pertinent past surgical history. No family history on file. History  Substance Use Topics  . Smoking status: Never Smoker   . Smokeless tobacco: Not on file  . Alcohol Use: No   OB History    No data available     Review of Systems  Gastrointestinal: Positive for vomiting. Negative for abdominal pain and diarrhea.  All other systems reviewed and are negative.     Allergies  Penicillins  Home Medications   Prior to Admission medications   Medication Sig Start Date End Date Taking? Authorizing Provider  ondansetron (ZOFRAN ODT) 8 MG disintegrating tablet Take 1 tablet (8 mg total) by mouth every 8 (eight) hours as needed for nausea or vomiting. 11/04/14   Linwood DibblesJon Dazaria Macneill, MD   BP 118/77 mmHg  Pulse 88  Temp(Src) 98.1 F (36.7 C) (Oral)  Resp 18  Ht 5\' 7"  (1.702 m)  Wt 145 lb (65.772 kg)  BMI 22.71 kg/m2  SpO2 100%  LMP  (LMP Unknown) Physical Exam  Constitutional: She appears well-developed and well-nourished. No distress.   HENT:  Head: Normocephalic and atraumatic.  Right Ear: External ear normal.  Left Ear: External ear normal.  Mucous membranes are dry  Eyes: Conjunctivae are normal. Right eye exhibits no discharge. Left eye exhibits no discharge. No scleral icterus.  Neck: Neck supple. No tracheal deviation present.  Cardiovascular: Normal rate, regular rhythm and intact distal pulses.   Pulmonary/Chest: Effort normal and breath sounds normal. No stridor. No respiratory distress. She has no wheezes. She has no rales.  Abdominal: Soft. Bowel sounds are normal. She exhibits no distension. There is no tenderness. There is no rebound and no guarding.  Musculoskeletal: She exhibits no edema or tenderness.  Neurological: She is alert. She has normal strength. No cranial nerve deficit (no facial droop, extraocular movements intact, no slurred speech) or sensory deficit. She exhibits normal muscle tone. She displays no seizure activity. Coordination normal.  Skin: Skin is warm and dry. No rash noted.  Psychiatric: She has a normal mood and affect.  Nursing note and vitals reviewed.   ED Course  Procedures (including critical care time) Labs Review Labs Reviewed  URINALYSIS, ROUTINE W REFLEX MICROSCOPIC - Abnormal; Notable for the following:    APPearance CLOUDY (*)    Specific Gravity, Urine 1.034 (*)    Bilirubin Urine MODERATE (*)    Ketones, ur >80 (*)    All other components within normal limits  CBC  WITH DIFFERENTIAL/PLATELET - Abnormal; Notable for the following:    Platelets 135 (*)    Neutrophils Relative % 78 (*)    All other components within normal limits  BASIC METABOLIC PANEL - Abnormal; Notable for the following:    Potassium 3.4 (*)    All other components within normal limits  PREGNANCY, URINE    Medications  0.9 %  sodium chloride infusion (1,000 mLs Intravenous New Bag/Given 11/04/14 1210)    Followed by  0.9 %  sodium chloride infusion (not administered)  ondansetron (ZOFRAN)  injection 4 mg (4 mg Intravenous Given 11/04/14 1222)     MDM   Final diagnoses:  Non-intractable vomiting with nausea, vomiting of unspecified type   Patient improved with fluids and antinausea medications. No further vomiting in the emergency department. I suspect the symptoms are viral in nature.  Patient is breast-feeding.  Her child is one year old.  The tox net database indicates that all though no definitive safety data is available for Zofran with lactation it is not a reason to discontinue breast feeding    Linwood Dibbles, MD 11/04/14 1257

## 2014-11-04 NOTE — ED Notes (Signed)
C/o vomiting, abd pain, HA started yesterday-denies diarrhea, urinary s/s

## 2014-11-04 NOTE — Discharge Instructions (Signed)

## 2015-05-02 ENCOUNTER — Encounter (HOSPITAL_COMMUNITY): Payer: Self-pay | Admitting: *Deleted

## 2015-05-02 ENCOUNTER — Other Ambulatory Visit (HOSPITAL_COMMUNITY)
Admission: RE | Admit: 2015-05-02 | Discharge: 2015-05-02 | Disposition: A | Discharge status: Home / Self Care | Payer: BLUE CROSS/BLUE SHIELD | Source: Ambulatory Visit | Attending: Family Medicine | Admitting: Family Medicine

## 2015-05-02 ENCOUNTER — Emergency Department (INDEPENDENT_AMBULATORY_CARE_PROVIDER_SITE_OTHER)
Admission: EM | Admit: 2015-05-02 | Discharge: 2015-05-02 | Disposition: A | Discharge status: Home / Self Care | Payer: BLUE CROSS/BLUE SHIELD | Source: Home / Self Care | Attending: Family Medicine | Admitting: Family Medicine

## 2015-05-02 DIAGNOSIS — N760 Acute vaginitis: Secondary | ICD-10-CM | POA: Insufficient documentation

## 2015-05-02 DIAGNOSIS — S31831A Laceration without foreign body of anus, initial encounter: Secondary | ICD-10-CM

## 2015-05-02 DIAGNOSIS — Z113 Encounter for screening for infections with a predominantly sexual mode of transmission: Secondary | ICD-10-CM | POA: Insufficient documentation

## 2015-05-02 LAB — POCT URINALYSIS DIP (DEVICE)
Bilirubin Urine: NEGATIVE
GLUCOSE, UA: NEGATIVE mg/dL
Hgb urine dipstick: NEGATIVE
Ketones, ur: NEGATIVE mg/dL
Leukocytes, UA: NEGATIVE
NITRITE: NEGATIVE
Protein, ur: NEGATIVE mg/dL
Specific Gravity, Urine: 1.025 (ref 1.005–1.030)
UROBILINOGEN UA: 2 mg/dL — AB (ref 0.0–1.0)
pH: 6 (ref 5.0–8.0)

## 2015-05-02 LAB — POCT PREGNANCY, URINE: Preg Test, Ur: NEGATIVE

## 2015-05-02 MED ORDER — AZITHROMYCIN 250 MG PO TABS
1000.0000 mg | ORAL_TABLET | Freq: Once | ORAL | Status: AC
  Administered 2015-05-02: 1000 mg via ORAL

## 2015-05-02 MED ORDER — AZITHROMYCIN 250 MG PO TABS
ORAL_TABLET | ORAL | Status: AC
  Filled 2015-05-02: qty 4

## 2015-05-02 MED ORDER — STARCH 51 % RE SUPP: 1.0000 | Freq: Two times a day (BID) | RECTAL | Status: DC

## 2015-05-02 MED ORDER — CEFTRIAXONE SODIUM 250 MG IJ SOLR
INTRAMUSCULAR | Status: AC
  Filled 2015-05-02: qty 250

## 2015-05-02 MED ORDER — CEFTRIAXONE SODIUM 250 MG IJ SOLR
250.0000 mg | Freq: Once | INTRAMUSCULAR | Status: AC
  Administered 2015-05-02: 250 mg via INTRAMUSCULAR

## 2015-05-02 NOTE — ED Provider Notes (Signed)
CSN: [id number]     Arrival date & time 05/02/15  1757 History   First MD Initiated Contact with Patient 05/02/15 1851     Chief Complaint  Patient presents with  . Vaginal Discharge   (Consider location/radiation/quality/duration/timing/severity/associated sxs/prior Treatment) Patient is a 24 y.o. female presenting with vaginal discharge. The history is provided by the patient.  Vaginal Discharge Quality:  Mucopurulent and yellow Severity:  Moderate Onset quality:  Gradual Progression:  Worsening (had vaginal and rectal intercourse 1 week ago with rectal pain on mon and vag d/c today.) Chronicity:  New Context: after intercourse   Context comment:  No use of condoms. Associated symptoms: no dysuria     History reviewed. No pertinent past medical history. History reviewed. No pertinent past surgical history. History reviewed. No pertinent family history. Social History  Substance Use Topics  . Smoking status: Never Smoker   . Smokeless tobacco: None  . Alcohol Use: No   OB History    No data available     Review of Systems  Gastrointestinal: Positive for rectal pain.  Genitourinary: Positive for vaginal discharge. Negative for dysuria, frequency, hematuria and menstrual problem.    Allergies  Penicillins  Home Medications   Prior to Admission medications   Medication Sig Start Date End Date Taking? Authorizing Provider  CLINDAMYCIN HCL PO Take by mouth.   Yes Historical Provider, MD  ondansetron (ZOFRAN ODT) 8 MG disintegrating tablet Take 1 tablet (8 mg total) by mouth every 8 (eight) hours as needed for nausea or vomiting. 11/04/14   Linwood Dibbles, MD  starch (ANUSOL) 51 % suppository Place 1 suppository rectally 2 (two) times daily. 05/02/15   Linna Hoff, MD   Meds Ordered and Administered this Visit   Medications  cefTRIAXone (ROCEPHIN) injection 250 mg (not administered)  azithromycin (ZITHROMAX) tablet 1,000 mg (not administered)    BP 120/81 mmHg  Pulse 77   Temp(Src) 98.3 F (36.8 C) (Oral)  Resp 18  SpO2 100%  LMP 04/13/2015 No data found.   Physical Exam  Constitutional: She is oriented to person, place, and time.  Abdominal: Soft. Normal appearance and bowel sounds are normal.  Genitourinary: Uterus normal. Rectal exam shows fissure.    Cervix exhibits discharge. Cervix exhibits no motion tenderness. Vaginal discharge found.  Neurological: She is alert and oriented to person, place, and time.  Skin: Skin is warm and dry.  Nursing note and vitals reviewed.   ED Course  Procedures (including critical care time)  Labs Review Labs Reviewed  HIV ANTIBODY (ROUTINE TESTING)  CERVICOVAGINAL ANCILLARY ONLY    Imaging Review No results found.   Visual Acuity Review  Right Eye Distance:   Left Eye Distance:   Bilateral Distance:    Right Eye Near:   Left Eye Near:    Bilateral Near:         MDM   1. Vaginitis   2. Tear of anal skin, initial encounter       Linna Hoff, MD 05/02/15 [phone]

## 2015-05-02 NOTE — ED Notes (Signed)
Pt  Reports  Vaginal  And  Rectal  Discharge   X  4  Days       Pt      Also reports   Being  Treated  For  Strep  Throat         With   clindamycin

## 2015-05-02 NOTE — Discharge Instructions (Signed)
We will call with positive test results and treat as indicated  °

## 2015-05-03 LAB — HIV ANTIBODY (ROUTINE TESTING W REFLEX): HIV Screen 4th Generation wRfx: NONREACTIVE

## 2015-05-04 LAB — CERVICOVAGINAL ANCILLARY ONLY
CHLAMYDIA, DNA PROBE: NEGATIVE
Neisseria Gonorrhea: NEGATIVE

## 2015-05-05 LAB — CERVICOVAGINAL ANCILLARY ONLY: Wet Prep (BD Affirm): NEGATIVE

## 2015-05-11 NOTE — ED Notes (Signed)
Final report of vaginal swab testing negative, no further action required

## 2016-08-29 ENCOUNTER — Other Ambulatory Visit (HOSPITAL_COMMUNITY): Payer: BLUE CROSS/BLUE SHIELD

## 2016-08-30 ENCOUNTER — Other Ambulatory Visit (HOSPITAL_COMMUNITY): Payer: BLUE CROSS/BLUE SHIELD

## 2016-08-31 ENCOUNTER — Other Ambulatory Visit (HOSPITAL_COMMUNITY): Payer: BLUE CROSS/BLUE SHIELD

## 2016-09-01 ENCOUNTER — Other Ambulatory Visit (HOSPITAL_COMMUNITY): Payer: BLUE CROSS/BLUE SHIELD

## 2016-09-02 ENCOUNTER — Other Ambulatory Visit (HOSPITAL_COMMUNITY): Payer: BLUE CROSS/BLUE SHIELD

## 2016-09-05 ENCOUNTER — Other Ambulatory Visit (HOSPITAL_COMMUNITY): Payer: BLUE CROSS/BLUE SHIELD

## 2016-09-06 ENCOUNTER — Other Ambulatory Visit (HOSPITAL_COMMUNITY): Payer: BLUE CROSS/BLUE SHIELD

## 2016-09-07 ENCOUNTER — Other Ambulatory Visit (HOSPITAL_COMMUNITY): Payer: BLUE CROSS/BLUE SHIELD

## 2016-09-08 ENCOUNTER — Other Ambulatory Visit (HOSPITAL_COMMUNITY): Payer: BLUE CROSS/BLUE SHIELD

## 2016-09-09 ENCOUNTER — Other Ambulatory Visit (HOSPITAL_COMMUNITY): Payer: BLUE CROSS/BLUE SHIELD

## 2016-09-12 ENCOUNTER — Other Ambulatory Visit (HOSPITAL_COMMUNITY): Payer: BLUE CROSS/BLUE SHIELD

## 2016-09-13 ENCOUNTER — Other Ambulatory Visit (HOSPITAL_COMMUNITY): Payer: BLUE CROSS/BLUE SHIELD

## 2016-09-14 ENCOUNTER — Other Ambulatory Visit (HOSPITAL_COMMUNITY): Payer: BLUE CROSS/BLUE SHIELD

## 2016-09-15 ENCOUNTER — Other Ambulatory Visit (HOSPITAL_COMMUNITY): Payer: BLUE CROSS/BLUE SHIELD

## 2016-09-16 ENCOUNTER — Other Ambulatory Visit (HOSPITAL_COMMUNITY): Payer: BLUE CROSS/BLUE SHIELD

## 2016-09-19 ENCOUNTER — Other Ambulatory Visit (HOSPITAL_COMMUNITY): Payer: BLUE CROSS/BLUE SHIELD

## 2016-09-19 ENCOUNTER — Ambulatory Visit (HOSPITAL_COMMUNITY): Payer: Self-pay | Admitting: Psychiatry

## 2016-09-20 ENCOUNTER — Other Ambulatory Visit (HOSPITAL_COMMUNITY): Payer: BLUE CROSS/BLUE SHIELD

## 2016-09-21 ENCOUNTER — Other Ambulatory Visit (HOSPITAL_COMMUNITY): Payer: BLUE CROSS/BLUE SHIELD

## 2016-09-22 ENCOUNTER — Other Ambulatory Visit (HOSPITAL_COMMUNITY): Payer: BLUE CROSS/BLUE SHIELD

## 2016-09-23 ENCOUNTER — Other Ambulatory Visit (HOSPITAL_COMMUNITY): Payer: BLUE CROSS/BLUE SHIELD

## 2016-09-26 ENCOUNTER — Other Ambulatory Visit (HOSPITAL_COMMUNITY): Payer: BLUE CROSS/BLUE SHIELD

## 2016-09-27 ENCOUNTER — Other Ambulatory Visit (HOSPITAL_COMMUNITY): Payer: BLUE CROSS/BLUE SHIELD

## 2016-09-28 ENCOUNTER — Other Ambulatory Visit (HOSPITAL_COMMUNITY): Payer: BLUE CROSS/BLUE SHIELD

## 2016-09-29 ENCOUNTER — Other Ambulatory Visit (HOSPITAL_COMMUNITY): Payer: BLUE CROSS/BLUE SHIELD

## 2016-09-30 ENCOUNTER — Other Ambulatory Visit (HOSPITAL_COMMUNITY): Payer: BLUE CROSS/BLUE SHIELD

## 2016-10-03 ENCOUNTER — Other Ambulatory Visit (HOSPITAL_COMMUNITY): Payer: BLUE CROSS/BLUE SHIELD

## 2016-10-04 ENCOUNTER — Other Ambulatory Visit (HOSPITAL_COMMUNITY): Payer: BLUE CROSS/BLUE SHIELD

## 2016-10-05 ENCOUNTER — Other Ambulatory Visit (HOSPITAL_COMMUNITY): Payer: BLUE CROSS/BLUE SHIELD

## 2016-10-06 ENCOUNTER — Other Ambulatory Visit (HOSPITAL_COMMUNITY): Payer: BLUE CROSS/BLUE SHIELD

## 2016-10-07 ENCOUNTER — Other Ambulatory Visit (HOSPITAL_COMMUNITY): Payer: BLUE CROSS/BLUE SHIELD

## 2016-10-10 ENCOUNTER — Other Ambulatory Visit (HOSPITAL_COMMUNITY): Payer: BLUE CROSS/BLUE SHIELD

## 2016-10-11 ENCOUNTER — Other Ambulatory Visit (HOSPITAL_COMMUNITY): Payer: BLUE CROSS/BLUE SHIELD

## 2016-10-12 ENCOUNTER — Other Ambulatory Visit (HOSPITAL_COMMUNITY): Payer: BLUE CROSS/BLUE SHIELD

## 2016-10-13 ENCOUNTER — Other Ambulatory Visit (HOSPITAL_COMMUNITY): Payer: BLUE CROSS/BLUE SHIELD

## 2016-10-14 ENCOUNTER — Other Ambulatory Visit (HOSPITAL_COMMUNITY): Payer: BLUE CROSS/BLUE SHIELD

## 2016-10-17 ENCOUNTER — Other Ambulatory Visit (HOSPITAL_COMMUNITY): Payer: BLUE CROSS/BLUE SHIELD

## 2016-10-18 ENCOUNTER — Other Ambulatory Visit (HOSPITAL_COMMUNITY): Payer: BLUE CROSS/BLUE SHIELD

## 2016-10-19 ENCOUNTER — Other Ambulatory Visit (HOSPITAL_COMMUNITY): Payer: BLUE CROSS/BLUE SHIELD

## 2016-10-20 ENCOUNTER — Other Ambulatory Visit (HOSPITAL_COMMUNITY): Payer: BLUE CROSS/BLUE SHIELD

## 2016-10-21 ENCOUNTER — Other Ambulatory Visit (HOSPITAL_COMMUNITY): Payer: BLUE CROSS/BLUE SHIELD

## 2016-10-31 ENCOUNTER — Ambulatory Visit (HOSPITAL_COMMUNITY): Payer: Self-pay | Admitting: Psychiatry

## 2017-02-26 ENCOUNTER — Encounter (HOSPITAL_BASED_OUTPATIENT_CLINIC_OR_DEPARTMENT_OTHER): Payer: Self-pay | Admitting: Emergency Medicine

## 2017-02-26 ENCOUNTER — Emergency Department (HOSPITAL_BASED_OUTPATIENT_CLINIC_OR_DEPARTMENT_OTHER)
Admission: EM | Admit: 2017-02-26 | Discharge: 2017-02-26 | Disposition: A | Discharge status: Home / Self Care | Payer: 59 | Attending: Physician Assistant | Admitting: Physician Assistant

## 2017-02-26 DIAGNOSIS — M7918 Myalgia, other site: Secondary | ICD-10-CM

## 2017-02-26 DIAGNOSIS — M791 Myalgia: Secondary | ICD-10-CM | POA: Diagnosis not present

## 2017-02-26 DIAGNOSIS — R519 Headache, unspecified: Secondary | ICD-10-CM

## 2017-02-26 DIAGNOSIS — R51 Headache: Secondary | ICD-10-CM

## 2017-02-26 MED ORDER — CYCLOBENZAPRINE HCL 10 MG PO TABS: 10.0000 mg | ORAL_TABLET | Freq: Two times a day (BID) | ORAL | 0 refills | Status: DC | PRN

## 2017-02-26 MED ORDER — IBUPROFEN 600 MG PO TABS: 600.0000 mg | ORAL_TABLET | Freq: Four times a day (QID) | ORAL | 0 refills | Status: DC | PRN
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
## 2017-02-26 NOTE — ED Notes (Signed)
ED Provider at bedside. 
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
## 2017-08-01 HISTORY — PX: INDUCED ABORTION: SHX677

## 2017-09-11 DIAGNOSIS — J111 Influenza due to unidentified influenza virus with other respiratory manifestations: Secondary | ICD-10-CM | POA: Diagnosis not present

## 2017-11-02 DIAGNOSIS — N760 Acute vaginitis: Secondary | ICD-10-CM | POA: Diagnosis not present

## 2017-12-05 DIAGNOSIS — B373 Candidiasis of vulva and vagina: Secondary | ICD-10-CM | POA: Diagnosis not present

## 2018-07-08 ENCOUNTER — Encounter (HOSPITAL_BASED_OUTPATIENT_CLINIC_OR_DEPARTMENT_OTHER): Payer: Self-pay | Admitting: *Deleted

## 2018-07-08 ENCOUNTER — Emergency Department (HOSPITAL_BASED_OUTPATIENT_CLINIC_OR_DEPARTMENT_OTHER)
Admission: EM | Admit: 2018-07-08 | Discharge: 2018-07-09 | Disposition: A | Discharge status: Home / Self Care | Payer: 59 | Attending: Emergency Medicine | Admitting: Emergency Medicine

## 2018-07-08 ENCOUNTER — Other Ambulatory Visit: Payer: Self-pay

## 2018-07-08 DIAGNOSIS — M533 Sacrococcygeal disorders, not elsewhere classified: Secondary | ICD-10-CM | POA: Insufficient documentation

## 2018-07-08 DIAGNOSIS — K5900 Constipation, unspecified: Secondary | ICD-10-CM | POA: Diagnosis not present

## 2018-07-08 DIAGNOSIS — M545 Low back pain: Secondary | ICD-10-CM | POA: Diagnosis not present

## 2018-07-08 MED ORDER — NAPROXEN 250 MG PO TABS
500.0000 mg | ORAL_TABLET | Freq: Once | ORAL | Status: AC
  Administered 2018-07-08: 500 mg via ORAL
  Filled 2018-07-08: qty 2

## 2018-07-08 MED ORDER — NAPROXEN 500 MG PO TABS: 500.0000 mg | ORAL_TABLET | Freq: Two times a day (BID) | ORAL | 0 refills | Status: DC

## 2018-07-08 MED ORDER — FLEET ENEMA 7-19 GM/118ML RE ENEM: 1.0000 | ENEMA | Freq: Every day | RECTAL | 1 refills | Status: DC | PRN

## 2018-07-08 NOTE — ED Provider Notes (Signed)
[facility] EMERGENCY DEPARTMENT Provider Note   CSN: [id number] Arrival date & time: 07/08/18  2015     History   Chief Complaint Chief Complaint  Patient presents with  . Back Pain    HPI Susan Lawson is a 27 y.o. female.  HPI  This is a 27 year old female who presents with buttock pain.  Patient reports that she woke up this morning with atraumatic midline lower back and buttock pain.  It is worse with movement and sitting.  She rates her pain at 10 out of 10.  She is not taking anything for the pain.  She denies any injury or falls.  She denies any abdominal pain.  No hematuria or dysuria.  Describes the pain as sharp.  She denies any weakness, numbness, tingling of the lower extremities.  She has never had pain like this before.  She does report history of constipation.  She has had issues with constipation in the past.  She denies any rectal pain.  History reviewed. No pertinent past medical history.  There are no active problems to display for this patient.   History reviewed. No pertinent surgical history.   OB History   None      Home Medications    Prior to Admission medications   Medication Sig Start Date End Date Taking? Authorizing Provider  CLINDAMYCIN HCL PO Take by mouth.    [provider]  cyclobenzaprine (FLEXERIL) 10 MG tablet Take 1 tablet (10 mg total) by mouth 2 (two) times daily as needed for muscle spasms. 02/26/17   Audry Pili, PA-C  ibuprofen (ADVIL,MOTRIN) 600 MG tablet Take 1 tablet (600 mg total) by mouth every 6 (six) hours as needed. 02/26/17   Audry Pili, PA-C  naproxen (NAPROSYN) 500 MG tablet Take 1 tablet (500 mg total) by mouth 2 (two) times daily. 07/08/18   Horton, Mayer Masker, MD  ondansetron (ZOFRAN ODT) 8 MG disintegrating tablet Take 1 tablet (8 mg total) by mouth every 8 (eight) hours as needed for nausea or vomiting. 11/04/14   Linwood Dibbles, MD  sodium phosphate (FLEET) 7-19 GM/118ML ENEM Place 133 mLs (1  enema total) rectally daily as needed for severe constipation. 07/08/18   Horton, Mayer Masker, MD  starch (ANUSOL) 51 % suppository Place 1 suppository rectally 2 (two) times daily. 05/02/15   Linna Hoff, MD    Family History No family history on file.  Social History Social History   Tobacco Use  . Smoking status: Never Smoker  . Smokeless tobacco: Never Used  Substance Use Topics  . Alcohol use: Yes    Comment: occasional  . Drug use: No     Allergies   Penicillins   Review of Systems Review of Systems  Constitutional: Negative for fever.  Respiratory: Negative for shortness of breath.   Cardiovascular: Negative for chest pain.  Gastrointestinal: Positive for constipation. Negative for abdominal pain, diarrhea, nausea and vomiting.  Genitourinary: Negative for dysuria and hematuria.  Musculoskeletal:       Buttock and back pain  Skin: Negative for color change and rash.  Neurological: Negative for weakness and numbness.  All other systems reviewed and are negative.    Physical Exam Updated Vital Signs BP 106/68 (BP Location: Right Arm)   Pulse 63   Temp 98.1 F (36.7 C)   Resp 16   Ht 1.702 m (5\' 7" )   Wt 70.3 kg   LMP 06/18/2018   SpO2 99%   BMI 24.28 kg/m  Physical Exam  Constitutional: She is oriented to person, place, and time. She appears well-developed and well-nourished. No distress.  HENT:  Head: Normocephalic and atraumatic.  Cardiovascular: Normal rate, regular rhythm and normal heart sounds.  Pulmonary/Chest: Effort normal and breath sounds normal. No respiratory distress.  Abdominal: Soft. There is no tenderness.  Musculoskeletal:  Tenderness palpation over the coccyx just at the superior end of the gluteal cleft, no fluctuance or induration, no significant erythema or overlying skin changes  Neurological: She is alert and oriented to person, place, and time.  Skin: Skin is warm and dry.  Psychiatric: She has a normal mood and affect.    Nursing note and vitals reviewed.    ED Treatments / Results  Labs (all labs ordered are listed, but only abnormal results are displayed) Labs Reviewed - No data to display  EKG None  Radiology No results found.  Procedures Procedures (including critical care time)  Medications Ordered in ED Medications  naproxen (NAPROSYN) tablet 500 mg (500 mg Oral Given 07/08/18 2324)     Initial Impression / Assessment and Plan / ED Course  I have reviewed the triage vital signs and the nursing notes.  Pertinent labs & imaging results that were available during my care of the patient were reviewed by me and considered in my medical decision making (see chart for details).     She presents with back and buttock pain.  She is overall nontoxic-appearing vital signs are reassuring.  No signs or symptoms of cauda equina.  Patient is atraumatic.  Pain is actually over the gluteal cleft over the coccyx.  No injury.  Location is appropriate for a pilonidal; however, there are no physical exam findings to suggest pilonidal.  This could be an early developing abscess.  She has no rectal pain or pressure but does endorse some constipation.  Recommend supportive measures.  Warm compresses post monitoring.  Naproxen for pain.  Was requesting an enema.  Discharged home with fleets enema prescription.  After history, exam, and medical workup I feel the patient has been appropriately medically screened and is safe for discharge home. Pertinent diagnoses were discussed with the patient. Patient was given return precautions.   Final Clinical Impressions(s) / ED Diagnoses   Final diagnoses:  Coccyx pain  Constipation, unspecified constipation type    ED Discharge Orders         Ordered    naproxen (NAPROSYN) 500 MG tablet  2 times daily     07/08/18 2347    sodium phosphate (FLEET) 7-19 GM/118ML ENEM  Daily PRN     07/08/18 2347           Shon BatonHorton, Courtney F, MD 07/08/18 2353

## 2018-07-08 NOTE — Discharge Instructions (Addendum)
You were seen today for buttock and coccyx pain.  There are no abnormalities on your physical exam.  This could be very early pilonidal cyst.  If you notice increased swelling, increased pain, drainage, redness of the area, you need to be reevaluated.  Regarding your constipation, take an enema at home as needed.

## 2018-07-08 NOTE — ED Notes (Addendum)
Pt has lower back pain in sacral area that began upon waking this am. She denies injury to back, states he was fine when she went to bed. Pt denies taking any medication for pain. Pt states that she feels a weakness with the pain, and it makes it difficult to get up after siting or lying down. Pt does not have normal bowel movements and has not had a bowel movement since Thursday.

## 2018-07-08 NOTE — ED Triage Notes (Signed)
Pt states she woke up with low back pain today. Denies injury or falls. Able to ambulate with slow steady gait to room 12.

## 2018-07-09 MED ORDER — ACETAMINOPHEN 325 MG PO TABS
ORAL_TABLET | ORAL | Status: AC
  Filled 2018-07-09: qty 2

## 2018-07-09 MED ORDER — ACETAMINOPHEN 325 MG PO TABS
650.0000 mg | ORAL_TABLET | Freq: Once | ORAL | Status: AC
  Administered 2018-07-09: 650 mg via ORAL

## 2018-07-11 ENCOUNTER — Ambulatory Visit (INDEPENDENT_AMBULATORY_CARE_PROVIDER_SITE_OTHER): Payer: 59

## 2018-07-11 ENCOUNTER — Encounter: Payer: Self-pay | Admitting: Physician Assistant

## 2018-07-11 ENCOUNTER — Ambulatory Visit (INDEPENDENT_AMBULATORY_CARE_PROVIDER_SITE_OTHER): Payer: 59 | Admitting: Physician Assistant

## 2018-07-11 ENCOUNTER — Telehealth: Payer: Self-pay | Admitting: Physician Assistant

## 2018-07-11 VITALS — BP 116/84 | HR 81 | Ht 67.0 in | Wt 151.0 lb

## 2018-07-11 DIAGNOSIS — M533 Sacrococcygeal disorders, not elsewhere classified: Secondary | ICD-10-CM

## 2018-07-11 DIAGNOSIS — Z113 Encounter for screening for infections with a predominantly sexual mode of transmission: Secondary | ICD-10-CM

## 2018-07-11 DIAGNOSIS — Z0279 Encounter for issue of other medical certificate: Secondary | ICD-10-CM | POA: Diagnosis not present

## 2018-07-11 DIAGNOSIS — Z7251 High risk heterosexual behavior: Secondary | ICD-10-CM | POA: Diagnosis not present

## 2018-07-11 DIAGNOSIS — R634 Abnormal weight loss: Secondary | ICD-10-CM

## 2018-07-11 DIAGNOSIS — Z7689 Persons encountering health services in other specified circumstances: Secondary | ICD-10-CM | POA: Diagnosis not present

## 2018-07-11 DIAGNOSIS — N898 Other specified noninflammatory disorders of vagina: Secondary | ICD-10-CM

## 2018-07-11 DIAGNOSIS — F3132 Bipolar disorder, current episode depressed, moderate: Secondary | ICD-10-CM

## 2018-07-11 DIAGNOSIS — K5904 Chronic idiopathic constipation: Secondary | ICD-10-CM

## 2018-07-11 DIAGNOSIS — Z30015 Encounter for initial prescription of vaginal ring hormonal contraceptive: Secondary | ICD-10-CM

## 2018-07-11 LAB — POCT URINALYSIS DIPSTICK
Glucose, UA: NEGATIVE
Nitrite, UA: NEGATIVE
Protein, UA: POSITIVE — AB
Spec Grav, UA: >=1.03 — AB (ref 1.010–1.025)
Urobilinogen, UA: 0.2 E.U./dL
pH, UA: 5.5 (ref 5.0–8.0)

## 2018-07-11 MED ORDER — QUETIAPINE FUMARATE 25 MG PO TABS: 25.0000 mg | ORAL_TABLET | Freq: Every day | ORAL | 1 refills | Status: DC

## 2018-07-11 MED ORDER — LUBIPROSTONE 24 MCG PO CAPS: 24.0000 ug | ORAL_CAPSULE | Freq: Two times a day (BID) | ORAL | 0 refills | Status: DC

## 2018-07-11 MED ORDER — ETONOGESTREL-ETHINYL ESTRADIOL 0.12-0.015 MG/24HR VA RING: VAGINAL_RING | VAGINAL | 12 refills | Status: DC

## 2018-07-11 MED ORDER — METRONIDAZOLE 500 MG PO TABS: 500.0000 mg | ORAL_TABLET | Freq: Two times a day (BID) | ORAL | 0 refills | Status: AC

## 2018-07-11 MED ORDER — AMITRIPTYLINE HCL 25 MG PO TABS: 25.0000 mg | ORAL_TABLET | Freq: Every day | ORAL | 1 refills | Status: DC

## 2018-07-11 NOTE — Telephone Encounter (Signed)
Received a fax from [employer] that Amitiza has been approved from 07/11/2018 through 07/10/2021. Form sent to scan and pharmacy aware.
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
## 2018-07-11 NOTE — Patient Instructions (Addendum)
For constipation: Stop Dulcolax Increase your dietary fiber Drink at least 1.5 L of water per day If you have not had a bowel movement in 2-3 days, start Amitiza. Take with a meal twice a day  For vaginal discharge: - start Metronidazole (antibiotic) twice a day for 1 week - do not drink alcohol during this time - no sex until completion of antibiotic - gently cleanse private area with warm water and finger tips (avoid soap in private area, douching, Vagisil, scented feminine products etc.)  For birth control: - 2 options: Start your Nuvaring today or start on last day of next menstrual period - Continue to use condoms for the first two weeks to prevent pregnancy - Keep Nuvaring in place for 3 weeks and remove for 1 week each month - You can remove Nuvaring (optionally) during intercourse for up to 2 hours   High-Fiber Diet Fiber, also called dietary fiber, is a type of carbohydrate found in fruits, vegetables, whole grains, and beans. A high-fiber diet can have many health benefits. Your health care provider may recommend a high-fiber diet to help:  Prevent constipation. Fiber can make your bowel movements more regular.  Lower your cholesterol.  Relieve hemorrhoids, uncomplicated diverticulosis, or irritable bowel syndrome.  Prevent overeating as part of a weight-loss plan.  Prevent heart disease, type 2 diabetes, and certain cancers.  What is my plan? The recommended daily intake of fiber includes:  38 grams for men under age 67.  30 grams for men over age 52.  25 grams for women under age 73.  21 grams for women over age 63.  You can get the recommended daily intake of dietary fiber by eating a variety of fruits, vegetables, grains, and beans. Your health care provider may also recommend a fiber supplement if it is not possible to get enough fiber through your diet. What do I need to know about a high-fiber diet?  Fiber supplements have not been widely studied for  their effectiveness, so it is better to get fiber through food sources.  Always check the fiber content on thenutrition facts label of any prepackaged food. Look for foods that contain at least 5 grams of fiber per serving.  Ask your dietitian if you have questions about specific foods that are related to your condition, especially if those foods are not listed in the following section.  Increase your daily fiber consumption gradually. Increasing your intake of dietary fiber too quickly may cause bloating, cramping, or gas.  Drink plenty of water. Water helps you to digest fiber. What foods can I eat? Grains Whole-grain breads. Multigrain cereal. Oats and oatmeal. Brown rice. Barley. Bulgur wheat. Millet. Bran muffins. Popcorn. Rye wafer crackers. Vegetables Sweet potatoes. Spinach. Kale. Artichokes. Cabbage. Broccoli. Green peas. Carrots. Squash. Fruits Berries. Pears. Apples. Oranges. Avocados. Prunes and raisins. Dried figs. Meats and Other Protein Sources Navy, kidney, pinto, and soy beans. Split peas. Lentils. Nuts and seeds. Dairy Fiber-fortified yogurt. Beverages Fiber-fortified soy milk. Fiber-fortified orange juice. Other Fiber bars. The items listed above may not be a complete list of recommended foods or beverages. Contact your dietitian for more options. What foods are not recommended? Grains White bread. Pasta made with refined flour. White rice. Vegetables Fried potatoes. Canned vegetables. Well-cooked vegetables. Fruits Fruit juice. Cooked, strained fruit. Meats and Other Protein Sources Fatty cuts of meat. Fried Environmental education officer or fried fish. Dairy Milk. Yogurt. Cream cheese. Sour cream. Beverages Soft drinks. Other Cakes and pastries. Butter and oils. The items listed  above may not be a complete list of foods and beverages to avoid. Contact your dietitian for more information. What are some tips for including high-fiber foods in my diet?  Eat a wide variety of  high-fiber foods.  Make sure that half of all grains consumed each day are whole grains.  Replace breads and cereals made from refined flour or white flour with whole-grain breads and cereals.  Replace white rice with brown rice, bulgur wheat, or millet.  Start the day with a breakfast that is high in fiber, such as a cereal that contains at least 5 grams of fiber per serving.  Use beans in place of meat in soups, salads, or pasta.  Eat high-fiber snacks, such as berries, raw vegetables, nuts, or popcorn. This information is not intended to replace advice given to you by your health care provider. Make sure you discuss any questions you have with your health care provider. Document Released: 07/18/2005 Document Revised: 12/24/2015 Document Reviewed: 12/31/2013 Elsevier Interactive Patient Education  [employer]2018 [employer]

## 2018-07-11 NOTE — Progress Notes (Signed)
HPI:                                                                Susan Lawson is a 27 y.o. female who presents to [facility] today to establish care  Current concerns: coccygeal pain, constipation, STI screening, FMLA  FMLA forms requested for recent work absences related to coccygeal pain.  Coccygeal pain - there was concern about a possible pilonidal sinus. Has been doing sitz baths and warm compresses. Reports symptoms have improved and she feels ready to go back to work. She still has mild pain, but is able to sit.  Constipation: this is a chronic problem. She states she has not had a meaningful bm in 3 weeks. She has been taking Dulocolax and just having very watery loose stools. She has lost 6 pounds this week and reports having no appetite. Denies fever, chills, nausea, vomiting or abdominal pain.   Requesting STI screening. No known exposure. Currently in a monogamous relationship. States she has had abnormal vaginal discharge for about 1 week. Discharge is white. There is some pruritus after intercourse. Denies dysuria, hematuria, urgency, frequency Reports history of recurrent BV infection. Does not use condoms. She would like to re-start Nuvaring for birth control.   Depression/Anxiety: reports she was followed by a psychiatrist, Dr. Wilford Grist at [facility] and was diagnosed with Bipolar disorder. She self-discontinued her medications because she was no longer able to afford her office visits. Reports Seroquel and Amitriptyline worked well for her. She endorses depressed mood, anhedonia, insomnia and anxiety most days.  Denies suicidal thinking. Denies auditory/visual hallucinations.   Depression screen PHQ 2/9 07/11/2018  Decreased Interest 2  Down, Depressed, Hopeless 2  PHQ - 2 Score 4  Altered sleeping 3  Tired, decreased energy 1  Change in appetite 3  Feeling bad or failure about yourself  1  Trouble  concentrating 1  Moving slowly or fidgety/restless 0  Suicidal thoughts 0  PHQ-9 Score 13    GAD 7 : Generalized Anxiety Score 07/11/2018  Nervous, Anxious, on Edge 2  Control/stop worrying 3  Worry too much - different things 3  Trouble relaxing 3  Restless 1  Easily annoyed or irritable 3  Afraid - awful might happen 2  Total GAD 7 Score 17      Past Medical History:  Diagnosis Date  . Bacterial vaginosis   . Constipation    Past Surgical History:  Procedure Laterality Date  . INDUCED ABORTION  2019  . NO PAST SURGERIES     Social History   Tobacco Use  . Smoking status: Never Smoker  . Smokeless tobacco: Never Used  Substance Use Topics  . Alcohol use: Not Currently   family history includes Asthma in her sister and sister; Breast cancer in her paternal grandmother; Cervical cancer in her maternal aunt, maternal aunt, and maternal grandmother; Diabetes in her paternal grandmother; Hypertension in her father and maternal grandfather; Lupus in her mother.    ROS: Review of Systems  Gastrointestinal: Positive for constipation.  Genitourinary:       + vaginal discharge  Musculoskeletal: Positive for back pain (coccyx).  Psychiatric/Behavioral: Positive for depression. The patient is nervous/anxious.      Medications: Current Outpatient  Medications  Medication Sig Dispense Refill  . CLINDAMYCIN HCL PO Take by mouth.    . naproxen (NAPROSYN) 500 MG tablet Take 1 tablet (500 mg total) by mouth 2 (two) times daily. 30 tablet 0  . sodium phosphate (FLEET) 7-19 GM/118ML ENEM Place 133 mLs (1 enema total) rectally daily as needed for severe constipation. 1 enema 1   No current facility-administered medications for this visit.    Allergies  Allergen Reactions  . Penicillins Swelling       Objective:  BP 116/84   Pulse 81   Ht 5\' 7"  (1.702 m)   Wt 151 lb (68.5 kg)   LMP 06/18/2018 (Exact Date)   BMI 23.65 kg/m  Gen:  alert, not ill-appearing, no  distress, appropriate for age HEENT: head normocephalic without obvious abnormality, conjunctiva and cornea clear, trachea midline Pulm: Normal work of breathing, normal phonation, clear to auscultation bilaterally, no wheezes, rales or rhonchi CV: Normal rate, regular rhythm, s1 and s2 distinct, no murmurs, clicks or rubs  GI: abdomen soft, nontender, no CVA tenderness GU: deferred Buttocks/Coccyx: tenderness overlying lower sacrum within the top third of the gluteal cleft, no visualized abnormality Neuro: alert and oriented x 3, no tremor MSK: extremities atraumatic, normal gait and station Skin: intact, no rashes on exposed skin, no jaundice, no cyanosis Psych: well-groomed, cooperative, good eye contact, depressed mood, affect mood-congruent, tearful, speech is articulate, and thought processes clear and goal-directed  A chaperone was used for the coccygeal exam, Tedra Coupe, PA-S  No results found for this or any previous visit (from the past 72 hour(s)). No results found.    Assessment and Plan: 27 y.o. female with   .[place] was seen today for establish care.  Diagnoses and all orders for this visit:  Encounter to establish care  Encounter for issuance of medical certificate  Routine screening for STI (sexually transmitted infection) -     Hepatitis C antibody -     HIV Antibody (routine testing w rflx) -     RPR  Vaginal discharge -     Cancel: SureSwab, Vaginosis/Vaginitis Plus -     SureSwab, Vaginosis/Vaginitis Plus -     metroNIDAZOLE (FLAGYL) 500 MG tablet; Take 1 tablet (500 mg total) by mouth 2 (two) times daily for 7 days. -     POCT Urinalysis Dipstick -     Urine Culture  Unprotected sexual intercourse -     Hepatitis C antibody -     HIV Antibody (routine testing w rflx) -     RPR -     Cancel: SureSwab, Vaginosis/Vaginitis Plus -     SureSwab, Vaginosis/Vaginitis Plus  Abnormal weight loss -     TSH + free T4 -     CBC -     COMPLETE  METABOLIC PANEL WITH GFR  Bipolar 1 disorder, depressed, moderate (HCC) -     QUEtiapine (SEROQUEL) 25 MG tablet; Take 1 tablet (25 mg total) by mouth at bedtime. -     amitriptyline (ELAVIL) 25 MG tablet; Take 1 tablet (25 mg total) by mouth at bedtime.  Encounter for initial prescription of vaginal ring hormonal contraceptive -     etonogestrel-ethinyl estradiol (NUVARING) 0.12-0.015 MG/24HR vaginal ring; Insert vaginally and leave in place for 3 consecutive weeks, then remove for 1 week.  Chronic idiopathic constipation -     lubiprostone (AMITIZA) 24 MCG capsule; Take 1 capsule (24 mcg total) by mouth 2 (two) times daily with a meal.  Coccygeal  pain, acute -     DG Sacrum/Coccyx   - Personally reviewed PMH, PSH, PFH, medications, allergies, HM - Age-appropriate cancer screening: overdue for Pap, last Pap 09/17/14 NILM - Influenza declined - Tdap declined - PHQ2 positive  Bipolar disorder, depressed PHQ9=13, no acute safety issues Re-starting Seroquel and Amitriptyline since this regimen worked well for her She is unable to afford Psychiatry at this time Follow-up in 1 month  Chronic constipation - d/c Dulcolax and OTC laxatives since she is now having laxative-induced diarrhea - increase dietary fiber and water - if no BM in the next 2-3 days, start Amitiza. Counseled to take with a meal twice a day  Vaginal discharge - self-swab vaginitis panel pending - given hx of BV, will treat empirically with Metronidazole - counseled to avoid sex until completion of antbiotic - counseled on general measures for vaginitis  Acute sacral/coccygeal pain - FMLA forms completed in office today - no evidence of pilonidal disease or abscess on exam - pain is most likely musculoskeletal - xray pending - cont Sitz bath, warm, compresses, and NSAID prn  Contraception - POC hCG negative - counseled on use of Nuvaring for preventing pregnancy  Patient education and anticipatory guidance  given Patient agrees with treatment plan Follow-up in 1 month for bipolar depression and constipation or sooner as needed if symptoms worsen or fail to improve  Levonne Hubertharley E. Diera Wirkkala PA-C

## 2018-07-12 LAB — URINE CULTURE
MICRO NUMBER: 91484014
Result:: NO GROWTH
SPECIMEN QUALITY:: ADEQUATE

## 2018-07-12 LAB — COMPLETE METABOLIC PANEL WITH GFR
AG Ratio: 1.3 (calc) (ref 1.0–2.5)
ALT: 14 U/L (ref 6–29)
AST: 17 U/L (ref 10–30)
Albumin: 4.2 g/dL (ref 3.6–5.1)
Alkaline phosphatase (APISO): 44 U/L (ref 33–115)
BUN: 8 mg/dL (ref 7–25)
CO2: 26 mmol/L (ref 20–32)
Calcium: 9.1 mg/dL (ref 8.6–10.2)
Chloride: 103 mmol/L (ref 98–110)
Creat: 0.81 mg/dL (ref 0.50–1.10)
GFR, EST NON AFRICAN AMERICAN: 99 mL/min/{1.73_m2} (ref >=60)
GFR, Est African American: 115 mL/min/{1.73_m2} (ref >=60)
Globulin: 3.2 g/dL (calc) (ref 1.9–3.7)
Glucose, Bld: 82 mg/dL (ref 65–99)
Potassium: 3.5 mmol/L (ref 3.5–5.3)
SODIUM: 140 mmol/L (ref 135–146)
Total Bilirubin: 0.4 mg/dL (ref 0.2–1.2)
Total Protein: 7.4 g/dL (ref 6.1–8.1)

## 2018-07-12 LAB — CBC
HCT: 37.3 % (ref 35.0–45.0)
Hemoglobin: 12.7 g/dL (ref 11.7–15.5)
MCH: 31.4 pg (ref 27.0–33.0)
MCHC: 34 g/dL (ref 32.0–36.0)
MCV: 92.3 fL (ref 80.0–100.0)
MPV: 12.4 fL (ref 7.5–12.5)
Platelets: 166 10*3/uL (ref 140–400)
RBC: 4.04 10*6/uL (ref 3.80–5.10)
RDW: 12.2 % (ref 11.0–15.0)
WBC: 5.3 10*3/uL (ref 3.8–10.8)

## 2018-07-12 LAB — RPR: RPR: NONREACTIVE

## 2018-07-12 LAB — HEPATITIS C ANTIBODY
Hepatitis C Ab: NONREACTIVE
SIGNAL TO CUT-OFF: 0.02 (ref <1.00)

## 2018-07-12 LAB — HIV ANTIBODY (ROUTINE TESTING W REFLEX): HIV: NONREACTIVE

## 2018-07-12 LAB — TSH+FREE T4: TSH W/REFLEX TO FT4: 1.03 mIU/L

## 2018-07-15 DIAGNOSIS — K5904 Chronic idiopathic constipation: Secondary | ICD-10-CM | POA: Insufficient documentation

## 2018-07-15 DIAGNOSIS — R634 Abnormal weight loss: Secondary | ICD-10-CM | POA: Insufficient documentation

## 2018-07-15 DIAGNOSIS — M533 Sacrococcygeal disorders, not elsewhere classified: Secondary | ICD-10-CM | POA: Insufficient documentation

## 2018-07-15 DIAGNOSIS — F3132 Bipolar disorder, current episode depressed, moderate: Secondary | ICD-10-CM | POA: Insufficient documentation

## 2018-07-15 DIAGNOSIS — Z30015 Encounter for initial prescription of vaginal ring hormonal contraceptive: Secondary | ICD-10-CM | POA: Insufficient documentation

## 2018-07-15 LAB — SURESWAB, VAGINOSIS/VAGINITIS PLUS
Atopobium vaginae: 7.1 Log (cells/mL)
BV CATEGORY: UNDETERMINED — AB
C. ALBICANS, DNA: NOT DETECTED
C. glabrata, DNA: NOT DETECTED
C. parapsilosis, DNA: NOT DETECTED
C. trachomatis RNA, TMA: NOT DETECTED
C. tropicalis, DNA: NOT DETECTED
LACTOBACILLUS SPECIES: 6.8 Log (cells/mL)
N. gonorrhoeae RNA, TMA: NOT DETECTED
TRICHOMONAS VAGINALIS RNA: DETECTED — AB

## 2018-07-16 LAB — POCT URINE PREGNANCY: Preg Test, Ur: NEGATIVE

## 2018-07-16 NOTE — Addendum Note (Signed)
Addended by: Thom ChimesHENRY, Floetta Brickey M on: 07/16/2018 08:31 AM   Modules accepted: Orders

## 2018-08-08 ENCOUNTER — Ambulatory Visit: Payer: Self-pay | Admitting: Physician Assistant

## 2018-08-20 DIAGNOSIS — N760 Acute vaginitis: Secondary | ICD-10-CM | POA: Diagnosis not present

## 2018-08-31 ENCOUNTER — Other Ambulatory Visit: Payer: Self-pay

## 2018-08-31 DIAGNOSIS — F3132 Bipolar disorder, current episode depressed, moderate: Secondary | ICD-10-CM

## 2018-08-31 MED ORDER — AMITRIPTYLINE HCL 25 MG PO TABS: 25.0000 mg | ORAL_TABLET | Freq: Every day | ORAL | 0 refills | Status: DC

## 2018-08-31 NOTE — Progress Notes (Signed)
Signed.

## 2018-09-21 ENCOUNTER — Other Ambulatory Visit: Payer: Self-pay

## 2018-09-21 ENCOUNTER — Encounter: Payer: Self-pay | Admitting: Family Medicine

## 2018-09-21 ENCOUNTER — Emergency Department (INDEPENDENT_AMBULATORY_CARE_PROVIDER_SITE_OTHER)
Admission: EM | Admit: 2018-09-21 | Discharge: 2018-09-21 | Disposition: A | Discharge status: Home / Self Care | Payer: 59 | Source: Home / Self Care

## 2018-09-21 DIAGNOSIS — B9689 Other specified bacterial agents as the cause of diseases classified elsewhere: Secondary | ICD-10-CM | POA: Diagnosis not present

## 2018-09-21 DIAGNOSIS — N760 Acute vaginitis: Secondary | ICD-10-CM | POA: Diagnosis not present

## 2018-09-21 DIAGNOSIS — Z30015 Encounter for initial prescription of vaginal ring hormonal contraceptive: Secondary | ICD-10-CM

## 2018-09-21 MED ORDER — ETONOGESTREL-ETHINYL ESTRADIOL 0.12-0.015 MG/24HR VA RING: VAGINAL_RING | VAGINAL | 12 refills | Status: DC

## 2018-09-21 MED ORDER — METRONIDAZOLE 0.75 % VA GEL: 1.0000 | Freq: Two times a day (BID) | VAGINAL | 11 refills | Status: DC

## 2018-09-21 NOTE — ED Provider Notes (Addendum)
[facility]    CSN: [id number] Arrival date & time: 09/21/18  1620     History   Chief Complaint Chief Complaint  Patient presents with  . Vaginal Discharge    HPI Susan Lawson is a 28 y.o. female.   28 year old woman who comes in for evaluation of what she suspects is bacterial vaginitis.  Reports 3 day history of thin, white, malodorous vaginal discharge. Reports she frequently gets bacterial vaginitis and this feels like her typical symptoms when she has bacterial vaginitis.   Reports no concern for STI.     Past Medical History:  Diagnosis Date  . Bacterial vaginosis   . Constipation     Patient Active Problem List   Diagnosis Date Noted  . Abnormal weight loss 07/15/2018  . Bipolar 1 disorder, depressed, moderate (HCC) 07/15/2018  . Encounter for initial prescription of vaginal ring hormonal contraceptive 07/15/2018  . Chronic idiopathic constipation 07/15/2018  . Coccygeal pain, acute 07/15/2018    Past Surgical History:  Procedure Laterality Date  . INDUCED ABORTION  2019  . NO PAST SURGERIES      OB History    Gravida  2   Para  1   Term      Preterm      AB  1   Living  1     SAB      TAB  1   Ectopic      Multiple      Live Births               Home Medications    Prior to Admission medications   Medication Sig Start Date End Date Taking? Authorizing Provider  amitriptyline (ELAVIL) 25 MG tablet Take 1 tablet (25 mg total) by mouth at bedtime. Due for follow up visit w/PCP 08/31/18  Yes Carlis Stable, PA-C  QUEtiapine (SEROQUEL) 25 MG tablet Take 1 tablet (25 mg total) by mouth at bedtime. 07/11/18  Yes Carlis Stable, PA-C  etonogestrel-ethinyl estradiol (NUVARING) 0.12-0.015 MG/24HR vaginal ring Insert vaginally and leave in place for 3 consecutive weeks, then remove for 1 week. 09/21/18   Elvina Sidle, MD  lubiprostone (AMITIZA) 24 MCG capsule Take 1 capsule (24 mcg total)  by mouth 2 (two) times daily with a meal. 07/11/18   Carlis Stable, PA-C  metroNIDAZOLE (METROGEL VAGINAL) 0.75 % vaginal gel Place 1 Applicatorful vaginally 2 (two) times daily. 09/21/18   Elvina Sidle, MD  naproxen (NAPROSYN) 500 MG tablet Take 1 tablet (500 mg total) by mouth 2 (two) times daily. 07/08/18   Horton, Mayer Masker, MD    Family History Family History  Problem Relation Age of Onset  . Lupus Mother   . Hypertension Father   . Asthma Sister   . Cervical cancer Maternal Aunt   . Cervical cancer Maternal Grandmother   . Hypertension Maternal Grandfather   . Breast cancer Paternal Grandmother   . Diabetes Paternal Grandmother   . Cervical cancer Maternal Aunt   . Asthma Sister     Social History Social History   Tobacco Use  . Smoking status: Never Smoker  . Smokeless tobacco: Never Used  Substance Use Topics  . Alcohol use: Not Currently  . Drug use: Never     Allergies   Lamictal [lamotrigine] and Penicillins   Review of Systems Review of Systems  Gastrointestinal: Negative for abdominal pain.  Genitourinary: Positive for vaginal discharge. Negative for dysuria, pelvic pain and urgency.  Physical Exam Triage Vital Signs ED Triage Vitals  Enc Vitals Group     BP      Pulse      Resp      Temp      Temp src      SpO2      Weight      Height      Head Circumference      Peak Flow      Pain Score      Pain Loc      Pain Edu?      Excl. in GC?    No data found.  Updated Vital Signs BP (!) 133/93   Pulse 98   Temp 97.9 F (36.6 C) (Oral)   Resp 16   LMP 09/10/2018   SpO2 100%    Physical Exam Vitals signs and nursing note reviewed.  Constitutional:      Appearance: Normal appearance.  HENT:     Head: Normocephalic.     Right Ear: External ear normal.     Left Ear: External ear normal.  Cardiovascular:     Rate and Rhythm: Normal rate.  Pulmonary:     Effort: Pulmonary effort is normal.  Abdominal:      General: Bowel sounds are normal.     Palpations: Abdomen is soft.     Tenderness: There is no abdominal tenderness. There is no right CVA tenderness, left CVA tenderness or guarding.  Musculoskeletal: Normal range of motion.  Skin:    General: Skin is warm and dry.  Neurological:     Mental Status: She is alert and oriented to person, place, and time.  Psychiatric:        Mood and Affect: Mood normal.        Behavior: Behavior normal.        Thought Content: Thought content normal.        Judgment: Judgment normal.      UC Treatments / Results  Labs (all labs ordered are listed, but only abnormal results are displayed) Labs Reviewed - No data to display  EKG None  Radiology No results found.  Procedures Procedures (including critical care time)  Medications Ordered in UC Medications - No data to display  Initial Impression / Assessment and Plan / UC Course  I have reviewed the triage vital signs and the nursing notes.  Pertinent labs & imaging results that were available during my care of the patient were reviewed by me and considered in my medical decision making (see chart for details).    Final Clinical Impressions(s) / UC Diagnoses   Final diagnoses:  BV (bacterial vaginosis)   Discharge Instructions   None    ED Prescriptions    Medication Sig Dispense Auth. Provider   etonogestrel-ethinyl estradiol (NUVARING) 0.12-0.015 MG/24HR vaginal ring Insert vaginally and leave in place for 3 consecutive weeks, then remove for 1 week. 1 each Elvina Sidle, MD   metroNIDAZOLE (METROGEL VAGINAL) 0.75 % vaginal gel Place 1 Applicatorful vaginally 2 (two) times daily. 70 g Elvina Sidle, MD     Controlled Substance Prescriptions [place] Controlled Substance Registry consulted? Not Applicable   Elvina Sidle, MD 09/21/18 1652    Elvina Sidle, MD 09/21/18 [phone]

## 2018-09-21 NOTE — ED Triage Notes (Signed)
PT reports recurrent BV. PT prefers gel form of medication. Symptoms for 2 days. PT is not concerned about STDs  PT is out of her seroquel as well

## 2018-10-08 ENCOUNTER — Other Ambulatory Visit: Payer: Self-pay | Admitting: Physician Assistant

## 2018-10-08 DIAGNOSIS — F3132 Bipolar disorder, current episode depressed, moderate: Secondary | ICD-10-CM

## 2018-11-14 ENCOUNTER — Other Ambulatory Visit: Payer: Self-pay | Admitting: Physician Assistant

## 2018-11-14 DIAGNOSIS — F3132 Bipolar disorder, current episode depressed, moderate: Secondary | ICD-10-CM

## 2018-12-01 ENCOUNTER — Other Ambulatory Visit: Payer: Self-pay | Admitting: Physician Assistant

## 2018-12-01 DIAGNOSIS — F3132 Bipolar disorder, current episode depressed, moderate: Secondary | ICD-10-CM

## 2018-12-07 ENCOUNTER — Other Ambulatory Visit: Payer: Self-pay | Admitting: Physician Assistant

## 2018-12-07 DIAGNOSIS — F3132 Bipolar disorder, current episode depressed, moderate: Secondary | ICD-10-CM

## 2019-01-10 ENCOUNTER — Encounter: Payer: Self-pay | Admitting: Physician Assistant

## 2019-01-10 ENCOUNTER — Ambulatory Visit (INDEPENDENT_AMBULATORY_CARE_PROVIDER_SITE_OTHER): Payer: 59 | Admitting: Physician Assistant

## 2019-01-10 DIAGNOSIS — F3132 Bipolar disorder, current episode depressed, moderate: Secondary | ICD-10-CM

## 2019-01-10 DIAGNOSIS — Z30015 Encounter for initial prescription of vaginal ring hormonal contraceptive: Secondary | ICD-10-CM | POA: Diagnosis not present

## 2019-01-10 MED ORDER — ETONOGESTREL-ETHINYL ESTRADIOL 0.12-0.015 MG/24HR VA RING: VAGINAL_RING | VAGINAL | 4 refills | Status: DC

## 2019-01-10 MED ORDER — QUETIAPINE FUMARATE 25 MG PO TABS: 25.0000 mg | ORAL_TABLET | Freq: Every evening | ORAL | 0 refills | Status: AC | PRN

## 2019-01-10 MED ORDER — AMITRIPTYLINE HCL 50 MG PO TABS: 50.0000 mg | ORAL_TABLET | Freq: Every day | ORAL | 0 refills | Status: DC

## 2019-01-10 MED ORDER — AMITRIPTYLINE HCL 10 MG PO TABS: 10.0000 mg | ORAL_TABLET | Freq: Every day | ORAL | 0 refills | Status: DC

## 2019-01-10 NOTE — Progress Notes (Signed)
Virtual Visit via Video Note  I connected with Susan Lawson on 01/10/19 at  8:10 AM EDT by a video enabled telemedicine application and verified that I am speaking with the correct person using two identifiers.   I discussed the limitations of evaluation and management by telemedicine and the availability of in person appointments. The patient expressed understanding and agreed to proceed.  History of Present Illness: HPI:                                                                Susan Lawson is a 28 y.o. female   CC: medication refills  Depression/Anxiety: reports she was followed by a psychiatrist, Dr. Rosezella Rumpf at [facility] and was diagnosed with Bipolar disorder in 2018. Reports Amitriptyline 60 mg nightly and Seroquel 25 mg prn worked well for her. She was lost to follow-up and has not had her medication for a couple of weeks Reports she has no appetite and has been having trouble sleeping. Denies mania/hypomania symptoms. Denies suicidal thinking. Denies auditory/visual hallucinations.  Also requesting refill of Nuvaring. She has had issues with her insurance covering the ring. Prior contraception incl. Mirena ("hated that") and OCP ("too forgetful")  Depression screen [facility] 2/9 01/10/2019 07/11/2018  Decreased Interest 3 2  Down, Depressed, Hopeless 2 2  PHQ - 2 Score 5 4  Altered sleeping 3 3  Tired, decreased energy 3 1  Change in appetite 3 3  Feeling bad or failure about yourself  1 1  Trouble concentrating 2 1  Moving slowly or fidgety/restless 0 0  Suicidal thoughts 0 0  PHQ-9 Score 17 13    GAD 7 : Generalized Anxiety Score 01/10/2019 07/11/2018  Nervous, Anxious, on Edge 3 2  Control/stop worrying 2 3  Worry too much - different things 3 3  Trouble relaxing 2 3  Restless 2 1  Easily annoyed or irritable 1 3  Afraid - awful might happen 0 2  Total GAD 7 Score 13 17      Past Medical History:  Diagnosis Date  . Bacterial vaginosis   .  Constipation    Past Surgical History:  Procedure Laterality Date  . INDUCED ABORTION  2019  . NO PAST SURGERIES     Social History   Tobacco Use  . Smoking status: Never Smoker  . Smokeless tobacco: Never Used  Substance Use Topics  . Alcohol use: Not Currently   family history includes Asthma in her sister and sister; Breast cancer in her paternal grandmother; Cervical cancer in her maternal aunt, maternal aunt, and maternal grandmother; Diabetes in her paternal grandmother; Hypertension in her father and maternal grandfather; Lupus in her mother.    ROS: negative except as noted in the HPI  Medications: Current Outpatient Medications  Medication Sig Dispense Refill  . amitriptyline (ELAVIL) 10 MG tablet Take 1 tablet (10 mg total) by mouth at bedtime. Take in addition to 50 mg tab for TDD 60 mg 90 tablet 0  . amitriptyline (ELAVIL) 50 MG tablet Take 1 tablet (50 mg total) by mouth at bedtime. Take in addition to 10 mg tab for TDD 60 mg 90 tablet 0  . etonogestrel-ethinyl estradiol (NUVARING) 0.12-0.015 MG/24HR vaginal ring Insert vaginally and leave in place for 3 consecutive weeks, then  remove for 1 week. 3 each 4  . naproxen (NAPROSYN) 500 MG tablet Take 1 tablet (500 mg total) by mouth 2 (two) times daily. (Patient not taking: Reported on 01/10/2019) 30 tablet 0  . QUEtiapine (SEROQUEL) 25 MG tablet Take 1 tablet (25 mg total) by mouth at bedtime as needed. 60 tablet 0   No current facility-administered medications for this visit.    Allergies  Allergen Reactions  . Lamictal [Lamotrigine] Rash  . Penicillins Swelling       Objective:  Temp 98.1 F (36.7 C) (Oral)   Wt 145 lb (65.8 kg)   BMI 22.71 kg/m  Gen:  alert, not ill-appearing, no distress, appropriate for age HEENT: head normocephalic without obvious abnormality, conjunctiva and cornea clear, trachea midline Pulm: Normal work of breathing, normal phonation Neuro: alert and oriented x 3 Psych:  cooperative, depressed mood, affect is not mood-congruent, speech is articulate, normal rate and volume; thought processes clear and goal-directed, normal judgment, good insight, no SI   No results found for this or any previous visit (from the past 72 hour(s)). No results found.    Assessment and Plan: 28 y.o. female with   .Susan Lawson was seen today for medication management.  Diagnoses and all orders for this visit:  Bipolar 1 disorder, depressed, moderate (HCC) -     amitriptyline (ELAVIL) 50 MG tablet; Take 1 tablet (50 mg total) by mouth at bedtime. Take in addition to 10 mg tab for TDD 60 mg -     amitriptyline (ELAVIL) 10 MG tablet; Take 1 tablet (10 mg total) by mouth at bedtime. Take in addition to 50 mg tab for TDD 60 mg -     QUEtiapine (SEROQUEL) 25 MG tablet; Take 1 tablet (25 mg total) by mouth at bedtime as needed.  Encounter for initial prescription of vaginal ring hormonal contraceptive -     etonogestrel-ethinyl estradiol (NUVARING) 0.12-0.015 MG/24HR vaginal ring; Insert vaginally and leave in place for 3 consecutive weeks, then remove for 1 week.   PHQ9=17, no acute safety issues GAD7=13 Re-start Elavil 60 mg QHS Seroquel 25 mg prn sleep  She will contact me if there is an insurance issue with Nuvaring and we will send in the patch  F/u e-visit in 1 month or sooner as needed if symptoms worsen or fail to improve   Follow Up Instructions:    I discussed the assessment and treatment plan with the patient. The patient was provided an opportunity to ask questions and all were answered. The patient agreed with the plan and demonstrated an understanding of the instructions.   The patient was advised to call back or seek an in-person evaluation if the symptoms worsen or if the condition fails to improve as anticipated.  I provided 10 minutes of non-face-to-face time during this encounter.   Carlis Stableharley Elizabeth Cummings, [place] JerseyPA-C

## 2019-02-11 ENCOUNTER — Ambulatory Visit (INDEPENDENT_AMBULATORY_CARE_PROVIDER_SITE_OTHER): Payer: 59 | Admitting: Physician Assistant

## 2019-02-11 ENCOUNTER — Encounter: Payer: Self-pay | Admitting: Physician Assistant

## 2019-02-11 VITALS — Temp 97.8°F

## 2019-02-11 DIAGNOSIS — Z30016 Encounter for initial prescription of transdermal patch hormonal contraceptive device: Secondary | ICD-10-CM

## 2019-02-11 DIAGNOSIS — F3132 Bipolar disorder, current episode depressed, moderate: Secondary | ICD-10-CM | POA: Diagnosis not present

## 2019-02-11 MED ORDER — XULANE 150-35 MCG/24HR TD PTWK: MEDICATED_PATCH | TRANSDERMAL | 2 refills | Status: AC

## 2019-02-11 MED ORDER — AMITRIPTYLINE HCL 50 MG PO TABS: 50.0000 mg | ORAL_TABLET | Freq: Every day | ORAL | 0 refills | Status: AC

## 2019-02-11 MED ORDER — AMITRIPTYLINE HCL 10 MG PO TABS: 10.0000 mg | ORAL_TABLET | Freq: Every day | ORAL | 0 refills | Status: DC

## 2019-02-11 NOTE — Progress Notes (Signed)
Virtual Visit via Video Note  I connected with Susan Lawson on 02/11/19 at  8:10 AM EDT by a video enabled telemedicine application and verified that I am speaking with the correct person using two identifiers.   I discussed the limitations of evaluation and management by telemedicine and the availability of in person appointments. The patient expressed understanding and agreed to proceed.  History of Present Illness: HPI:                                                                Susan Lawson is a 28 y.o. female   CC: medication refills  Interval hx 02/11/19 Mood: "pretty stable" Anxiety: "a whole lot better," no panic attacks Sleep: "better," getting 7-8 hr per night, has not needed to take Seroquel Current medications Amitriptyline 60 mg nightly Seroquel 25-50 mg QHS prn for insomnia  In terms of contraception, insurance would not cover Nuvaring. She is interested in trying the patch. Denies hx of VTE. Denies tobacco use. Prior contraception incl. Mirena ("hated that") and OCP ("too forgetful")  01/10/19 Depression/Anxiety: reports she was followed by a psychiatrist, Dr. Rosezella Rumpf at [facility] and was diagnosed with Bipolar disorder in 2018. Reports Amitriptyline 60 mg nightly and Seroquel 25 mg prn worked well for her. She was lost to follow-up and has not had her medication for a couple of weeks Reports she has no appetite and has been having trouble sleeping. Denies mania/hypomania symptoms. Denies suicidal thinking. Denies auditory/visual hallucinations.    Depression screen [facility] 2/9 02/11/2019 01/10/2019 07/11/2018  Decreased Interest 0 3 2  Down, Depressed, Hopeless 0 2 2  PHQ - 2 Score 0 5 4  Altered sleeping 0 3 3  Tired, decreased energy 0 3 1  Change in appetite 0 3 3  Feeling bad or failure about yourself  0 1 1  Trouble concentrating 2 2 1   Moving slowly or fidgety/restless 0 0 0  Suicidal thoughts 0 0 0  PHQ-9 Score 2 17 13     GAD 7 :  Generalized Anxiety Score 02/11/2019 01/10/2019 07/11/2018  Nervous, Anxious, on Edge 0 3 2  Control/stop worrying 0 2 3  Worry too much - different things 0 3 3  Trouble relaxing 0 2 3  Restless 0 2 1  Easily annoyed or irritable 0 1 3  Afraid - awful might happen 0 0 2  Total GAD 7 Score 0 13 17      Past Medical History:  Diagnosis Date  . Bacterial vaginosis   . Constipation    Past Surgical History:  Procedure Laterality Date  . INDUCED ABORTION  2019  . NO PAST SURGERIES     Social History   Tobacco Use  . Smoking status: Never Smoker  . Smokeless tobacco: Never Used  Substance Use Topics  . Alcohol use: Not Currently   family history includes Asthma in her sister and sister; Breast cancer in her paternal grandmother; Cervical cancer in her maternal aunt, maternal aunt, and maternal grandmother; Diabetes in her paternal grandmother; Hypertension in her father and maternal grandfather; Lupus in her mother.    ROS: negative except as noted in the HPI  Medications: Current Outpatient Medications  Medication Sig Dispense Refill  . amitriptyline (ELAVIL) 10 MG tablet Take 1  tablet (10 mg total) by mouth at bedtime. Take in addition to 50 mg tab for TDD 60 mg 90 tablet 0  . amitriptyline (ELAVIL) 50 MG tablet Take 1 tablet (50 mg total) by mouth at bedtime. Take in addition to 10 mg tab for TDD 60 mg 90 tablet 0  . QUEtiapine (SEROQUEL) 25 MG tablet Take 1 tablet (25 mg total) by mouth at bedtime as needed. 60 tablet 0  . etonogestrel-ethinyl estradiol (NUVARING) 0.12-0.015 MG/24HR vaginal ring Insert vaginally and leave in place for 3 consecutive weeks, then remove for 1 week. (Patient not taking: Reported on 02/11/2019) 3 each 4   No current facility-administered medications for this visit.    Allergies  Allergen Reactions  . Lamictal [Lamotrigine] Rash  . Penicillins Swelling       Objective:  Temp 97.8 F (36.6 C) (Oral)  BP Readings from Last 3 Encounters:   09/21/18 (!) 133/93  07/11/18 116/84  07/08/18 106/68   Gen:  alert, not ill-appearing, no distress, appropriate for age HEENT: head normocephalic without obvious abnormality, conjunctiva and cornea clear, trachea midline Pulm: Normal work of breathing, normal phonation Neuro: alert and oriented x 3 Psych: cooperative, euthymic mood, affect is not mood-congruent, speech is articulate, normal rate and volume; thought processes clear and goal-directed, normal judgment, good insight, no SI   No results found for this or any previous visit (from the past 72 hour(s)). No results found.    Assessment and Plan: 28 y.o. female with   .Susan Lawson was seen today for medication management.  Diagnoses and all orders for this visit:  Bipolar 1 disorder, depressed, moderate (HCC)  Bipolar 1 PHQ9=2, improved from 17 GAD7=0, improved from 13 Cont Elavil 60 mg QHS Follow-up in 4 months  Contraception: Counseled on use of patch  Denies use of tobacco products or history of VTE. Her last in office BP was out of range. No prior hx of HTN Will need to monitor closely  Follow-up in office in 2 months for BP check or sooner as needed if symptoms worsen or fail to improve   Follow Up Instructions:    I discussed the assessment and treatment plan with the patient. The patient was provided an opportunity to ask questions and all were answered. The patient agreed with the plan and demonstrated an understanding of the instructions.   The patient was advised to call back or seek an in-person evaluation if the symptoms worsen or if the condition fails to improve as anticipated.  I provided 10 minutes of non-face-to-face time during this encounter.   Carlis Stableharley Elizabeth Dhiren Azimi, [place]-C

## 2019-04-10 ENCOUNTER — Other Ambulatory Visit: Payer: Self-pay | Admitting: Physician Assistant

## 2019-04-10 DIAGNOSIS — F3132 Bipolar disorder, current episode depressed, moderate: Secondary | ICD-10-CM

## 2019-10-08 ENCOUNTER — Encounter: Payer: Self-pay | Admitting: Medical-Surgical
